# Patient Record
Sex: Female | Born: 1970 | Race: White | Hispanic: No | Smoking: Never smoker
Health system: Southern US, Community
[De-identification: ages and names within clinical notes are randomized; demographics above are authoritative.]

## PROBLEM LIST (undated history)

## (undated) DIAGNOSIS — F419 Anxiety disorder, unspecified: Secondary | ICD-10-CM

## (undated) DIAGNOSIS — M549 Dorsalgia, unspecified: Secondary | ICD-10-CM

## (undated) HISTORY — PX: MOUTH SURGERY: SHX715

## (undated) HISTORY — DX: Anxiety disorder, unspecified: F41.9

## (undated) HISTORY — DX: Dorsalgia, unspecified: M54.9

---
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
## 2019-05-15 ENCOUNTER — Telehealth: Payer: Self-pay | Admitting: *Deleted

## 2019-05-15 ENCOUNTER — Telehealth: Payer: Self-pay

## 2019-05-15 NOTE — Telephone Encounter (Signed)
Copied from CRM #[id number]. Topic: Appointment Scheduling - New Patient >> May 14, 2019  4:02 PM Crawford, Mary M wrote: New patient has been scheduled for your office. Provider: [place] Date of Appointment: 11/18  Route to department's PEC pool. 

## 2019-05-15 NOTE — Telephone Encounter (Signed)
Copied from [facility] [place] [id number]. Topic: Appointment Scheduling - New Patient >> May 14, 2019  4:02 PM Loma Boston wrote: New patient has been scheduled for your office. Provider: Conley Canal Date of Appointment: 11/18  Route to department's PEC pool.

## 2019-05-20 ENCOUNTER — Other Ambulatory Visit: Payer: Self-pay

## 2019-05-20 ENCOUNTER — Ambulatory Visit: Payer: 59 | Admitting: Family

## 2019-05-20 ENCOUNTER — Encounter: Payer: Self-pay | Admitting: Family

## 2019-05-20 VITALS — BP 112/81 | HR 73 | Temp 97.1°F | Resp 16 | Ht 65.0 in | Wt 168.0 lb

## 2019-05-20 DIAGNOSIS — F419 Anxiety disorder, unspecified: Secondary | ICD-10-CM | POA: Diagnosis not present

## 2019-05-20 DIAGNOSIS — F329 Major depressive disorder, single episode, unspecified: Secondary | ICD-10-CM

## 2019-05-20 DIAGNOSIS — F32A Depression, unspecified: Secondary | ICD-10-CM

## 2019-05-20 DIAGNOSIS — G8929 Other chronic pain: Secondary | ICD-10-CM | POA: Diagnosis not present

## 2019-05-20 DIAGNOSIS — M545 Low back pain: Secondary | ICD-10-CM

## 2019-05-20 MED ORDER — ESCITALOPRAM OXALATE 20 MG PO TABS: 20.0000 mg | ORAL_TABLET | Freq: Every day | ORAL | 0 refills | Status: DC

## 2019-05-20 MED ORDER — ESCITALOPRAM OXALATE 20 MG PO TABS: 20.0000 mg | ORAL_TABLET | Freq: Every day | ORAL | Status: DC

## 2019-05-20 MED ORDER — BUPROPION HCL ER (XL) 150 MG PO TB24: 150.0000 mg | ORAL_TABLET | Freq: Every day | ORAL | 1 refills | Status: DC

## 2019-05-20 NOTE — Patient Instructions (Addendum)
Please add Wellbutrin XL 150mg  once daily.   Continue Escitalopram. Complete lab work prior to leaving.  Call [facility] to schedule an appointment with a counselor.  Welcome to [facility]!

## 2019-05-20 NOTE — Progress Notes (Signed)
0

## 2019-05-20 NOTE — Progress Notes (Signed)
Subjective:    Patient ID: Virginia Ayala, female    DOB: 1970-11-16, 48 y.o.   MRN: [id number]  HPI   Patient is a 48 yr old female who presents today to establish care.   Anxiety/Depression- reports that she feels indifferent. She reports that her depression started at 43, though was not diagnosed until after her second child was born. She has previously on prozac and citalopram.  Was switched to escitalopram.  Sleeps   Reports that she uses alprazolam prn (seems to need more before her period) Notes occasional panic attacks.    Has 2 children adult daughter, has a 24 yr old daughter  She is seeing ortho for back pain- reports hx of bulging disc. Diagnosed 6-7 years ago.  Reports that she had ESI's but that caused weight gain so she discontinued.  Reports that she is doing PT, taking gabapentin.     Review of Systems  Constitutional: Negative for unexpected weight change.  HENT: Negative for hearing loss and rhinorrhea.   Eyes: Negative for visual disturbance.  Respiratory: Negative for cough.   Cardiovascular: Negative for leg swelling.  Gastrointestinal: Negative for constipation and diarrhea.  Genitourinary: Negative for dysuria and frequency.  Musculoskeletal: Positive for back pain. Negative for arthralgias (occasional right knee pain) and myalgias.  Skin: Negative for rash.  Neurological: Negative for headaches.  Hematological: Negative for adenopathy.  Psychiatric/Behavioral:       See HPI    Past Medical History:  Diagnosis Date  . Anxiety   . 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 Depression      Social History   Socioeconomic History  . Marital status: Divorced    Spouse name: Not on file  . Number of children: Not on file  . Years of education: Not on file  . Highest education level: Not on file  Occupational History  . Not on file  Social Needs  . Financial resource strain: Not on file  . Food insecurity    Worry: Not on file   Inability: Not on file  . Transportation needs    Medical: Not on file    Non-medical: Not on file  Tobacco Use  . Smoking status: Never Smoker  . Smokeless tobacco: Never Used  Substance and Sexual Activity  . Alcohol use: Not Currently  . Drug use: Never  . Sexual activity: Not Currently  Lifestyle  . Physical activity    Days per week: 0 days    Minutes per session: Not on file  . Stress: Not on file  Relationships  . Social connections    Talks on phone: More than three times a week    Gets together: Once a week    Attends religious service: Never    Active member of club or organization: No    Attends meetings of clubs or organizations: Never    Relationship status: Separated  . Intimate partner violence    Fear of current or ex partner: No    Emotionally abused: No    Physically abused: No    Forced sexual activity: No  Other Topics Concern  . 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 MOUTH SURGERY     in her 58's  . ORTHOPEDIC SURGERY Left  1980    Family History  Problem Relation Age of Onset  . Diabetes Father   . Pulmonary embolism Father   . Cancer Maternal Grandmother   . Heart disease Maternal Grandmother   . Heart disease Paternal Grandfather   . Pulmonary embolism Paternal Aunt     Allergies  Allergen Reactions  . Lodine [Etodolac]   . Penicillins   . Sulfa Antibiotics     Current Outpatient Medications on File Prior to Visit  Medication Sig Dispense Refill  . ALPRAZolam (XANAX) 0.5 MG tablet Take 0.5 mg by mouth at bedtime as needed for anxiety.    . gabapentin (NEURONTIN) 100 MG capsule Take 100 mg by mouth 3 (three) times daily.     No current facility-administered medications on file prior to visit.     BP 112/81 (BP Location: Right Arm, Patient Position: Sitting, Cuff Size:  Small)   Pulse 73   Temp (!) 97.1 F (36.2 C) (Temporal)   Resp 16   Ht 5\' 5"  (1.651 m)   Wt 168 lb (76.2 kg)   SpO2 98%   BMI 27.96 kg/m        Objective:   Physical Exam Constitutional:      Appearance: She is well-developed.  Neck:     Musculoskeletal: Neck supple.     Thyroid: No thyromegaly.  Cardiovascular:     Rate and Rhythm: Normal rate and regular rhythm.     Heart sounds: Normal heart sounds. No murmur.  Pulmonary:     Effort: Pulmonary effort is normal. No respiratory distress.     Breath sounds: Normal breath sounds. No wheezing.  Musculoskeletal:        General: No swelling.  Skin:    General: Skin is warm and dry.  Neurological:     Mental Status: She is alert and oriented to person, place, and time.  Psychiatric:        Attention and Perception: Attention normal.        Mood and Affect: Affect is flat.        Speech: Speech is delayed (slightly delayed speech).        Behavior: Behavior normal.        Thought Content: Thought content normal.        Judgment: Judgment normal.           Assessment & Plan:  Depression- uncontrolled. Suggested that she establish with a local counselor. I have given her the contact information for [place]. Will also add wellbutrin once daily.  Chronic back pain- following with orthopedics. Maintained on gabapentin which is helping her pain.  Anxiety- using xanax PRN. A controlled substance contract is signed today and will obtain a UDS.   Her previous practice was at [facility] [place]- plan to have her sign a records release next visit so we can obtain records.

## 2019-05-21 LAB — PAIN MGMT, PROFILE 8 W/CONF, U
6 Acetylmorphine: NEGATIVE ng/mL
Alcohol Metabolites: NEGATIVE ng/mL (ref <500)
Amphetamines: NEGATIVE ng/mL
Benzodiazepines: NEGATIVE ng/mL
Buprenorphine, Urine: NEGATIVE ng/mL
Cocaine Metabolite: NEGATIVE ng/mL
Creatinine: 145.1 mg/dL
MDMA: NEGATIVE ng/mL
Marijuana Metabolite: NEGATIVE ng/mL
Opiates: NEGATIVE ng/mL
Oxidant: NEGATIVE ug/mL
Oxycodone: NEGATIVE ng/mL
pH: 6.3 (ref 4.5–9.0)

## 2019-06-23 ENCOUNTER — Telehealth: Payer: Self-pay | Admitting: Family

## 2019-06-23 ENCOUNTER — Other Ambulatory Visit: Payer: Self-pay

## 2019-06-23 ENCOUNTER — Encounter: Payer: Self-pay | Admitting: Family

## 2019-06-23 ENCOUNTER — Ambulatory Visit (INDEPENDENT_AMBULATORY_CARE_PROVIDER_SITE_OTHER): Payer: 59 | Admitting: Family

## 2019-06-23 VITALS — BP 115/81 | HR 69 | Temp 96.9°F | Resp 16 | Ht 65.0 in | Wt 172.0 lb

## 2019-06-23 DIAGNOSIS — F32A Depression, unspecified: Secondary | ICD-10-CM | POA: Insufficient documentation

## 2019-06-23 DIAGNOSIS — Z0000 Encounter for general adult medical examination without abnormal findings: Secondary | ICD-10-CM

## 2019-06-23 DIAGNOSIS — F329 Major depressive disorder, single episode, unspecified: Secondary | ICD-10-CM | POA: Diagnosis not present

## 2019-06-23 HISTORY — DX: Depression, unspecified: F32.A

## 2019-06-23 HISTORY — DX: Major depressive disorder, single episode, unspecified: F32.9

## 2019-06-23 MED ORDER — BUPROPION HCL ER (XL) 300 MG PO TB24: 300.0000 mg | ORAL_TABLET | Freq: Every day | ORAL | 3 refills | Status: DC

## 2019-06-23 NOTE — Progress Notes (Signed)
Subjective:    Patient ID: Virginia Ayala, female    DOB: Dec 12, 1970, 48 y.o.   MRN: [id number]  HPI  Patient presents today for complete physical.  Immunizations: declines flu shot, last tetanus was 2013.   Diet: concerned about weight gain.  Drinks water with crystal light Wt Readings from Last 3 Encounters:  06/23/19 172 lb (78 kg)  05/20/19 168 lb (76.2 kg)  Exercise: limited by lumbar disc due to pain  Pap Smear: 2 yrs ago Mammogram: 2  Years ago  Depression- last visit symptoms were noted to be uncontrolled. We added wellbutrin to her lexapro and recommended that she establish with a local counselor.   No side effects on wellbutrin. Reports trouble motivating, but denies trouble sleeping. Especially getting out of bed in the morning. Does not have many things she looks forward to.   Review of Systems  Constitutional: Negative for unexpected weight change.  HENT: Negative for hearing loss and rhinorrhea.   Eyes: Negative for visual disturbance.  Respiratory: Negative for cough and shortness of breath.   Cardiovascular: Negative for chest pain.  Gastrointestinal: Negative for constipation and diarrhea.  Genitourinary: Negative for dysuria and frequency.  Musculoskeletal: Positive for back pain.  Skin: Negative for rash.  Neurological: Negative for numbness.  Hematological: Negative for adenopathy.  Psychiatric/Behavioral:       See HPI   Past Medical History:  Diagnosis Date  . Anxiety   . 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 Depression      Social History   Socioeconomic History  . Marital status: Divorced    Spouse name: Not on file  . Number of children: Not on file  . Years of education: Not on file  . Highest education level: Not on file  Occupational History  . Not on file  Tobacco Use  . Smoking status: Never Smoker  . Smokeless tobacco: Never Used  Substance and Sexual Activity  . Alcohol use: Not Currently  . Drug use: Never  .  Sexual activity: Not Currently  Other Topics Concern  . 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 Difficulty of Paying Living Expenses: Not on file  Food Insecurity:   . Worried About Programme researcher, broadcasting/film/video in the Last Year: Not on file  . Ran Out of Food in the Last Year: Not on file  Transportation Needs:   . Lack of Transportation (Medical): Not on file  . Lack of Transportation (Non-Medical): Not on file  Physical Activity: Unknown  . Days of Exercise per Week: 0 days  . Minutes of Exercise per Session: Not on file  Stress:   . Feeling of Stress : Not on file  Social Connections: Moderately Isolated  . Frequency of Communication with Friends and Family: More than three times a week  . Frequency of Social Gatherings with Friends and Family: Once a week  . Attends Religious Services: Never  . Active Member of Clubs or Organizations: No  . Attends Banker Meetings: Never  . Marital Status: Separated  Intimate Partner Violence: Not At Risk  . Fear of Current or Ex-Partner: No  . Emotionally Abused: No  . Physically Abused: No  . Sexually Abused: No    Past Surgical  History:  Procedure Laterality Date  . MOUTH SURGERY     in her 64's  . ORTHOPEDIC SURGERY Left 1980    Family History  Problem Relation Age of Onset  . Diabetes Father   . Pulmonary embolism Father   . Cancer Maternal Grandmother   . Heart disease Maternal Grandmother   . Heart disease Paternal Grandfather   . Pulmonary embolism Paternal Aunt     Allergies  Allergen Reactions  . Lodine [Etodolac]   . Penicillins   . Sulfa Antibiotics     Current Outpatient Medications on File Prior to Visit  Medication Sig Dispense Refill  . ALPRAZolam (XANAX) 0.5 MG tablet Take 0.5 mg by mouth at bedtime as needed for anxiety.    Marland Kitchen  escitalopram (LEXAPRO) 20 MG tablet Take 1 tablet (20 mg total) by mouth daily. 90 tablet 0  . gabapentin (NEURONTIN) 100 MG capsule Take 100 mg by mouth 3 (three) times daily.     No current facility-administered medications on file prior to visit.    BP 115/81 (BP Location: Right Arm, Patient Position: Sitting, Cuff Size: Large)   Pulse 69   Temp (!) 96.9 F (36.1 C) (Temporal)   Resp 16   Ht 5\' 5"  (1.651 m)   Wt 172 lb (78 kg)   SpO2 98%   BMI 28.62 kg/m       Objective:   Physical Exam  Physical Exam  Constitutional: She is oriented to person, place, and time. She appears well-developed and well-nourished. No distress.  HENT:  Head: Normocephalic and atraumatic.  Right Ear: Tympanic membrane and ear canal normal.  Left Ear: Tympanic membrane and ear canal normal.  Mouth/Throat: Not examined, pt wearing mask Eyes: Pupils are equal, round, and reactive to light. No scleral icterus.  Neck: Normal range of motion. No thyromegaly present.  Cardiovascular: Normal rate and regular rhythm.   No murmur heard. Pulmonary/Chest: Effort normal and breath sounds normal. No respiratory distress. He has no wheezes. She has no rales. She exhibits no tenderness.  Abdominal: Soft. Bowel sounds are normal. She exhibits no distension and no mass. There is no tenderness. There is no rebound and no guarding.  Musculoskeletal: She exhibits no edema.  Lymphadenopathy:    She has no cervical adenopathy.  Neurological: She is alert and oriented to person, place, and time. She has normal patellar reflexes. She exhibits normal muscle tone. Coordination normal. 3+ bilateral patellar reflexes Skin: Skin is warm and dry.  Psychiatric: She has a somewhat flattened mood and affect. Her behavior is normal. Judgment and thought content normal.  Breasts: Examined lying Right: Without masses, retractions, discharge or axillary adenopathy.  Left: Without masses, retractions, discharge or axillary adenopathy.   Pelvic: deferred       Assessment & Plan:   Preventative care- discussed healthy diet, exercise and weight loss. Mammogram due. Pap and tetanus up to date per patient. I still have not received her old records.   Depression- uncontrolled. Will increase wellbutrin from 150 to 300mg  xr daily. Continue lexapro. Discussed value of counseling. Plan follow up in 1 month. If no improvement consider referral to psychiatry.   This visit occurred during the SARS-CoV-2 public health emergency.  Safety protocols were in place, including screening questions prior to the visit, additional usage of staff PPE, and extensive cleaning of exam room while observing appropriate contact time as indicated for disinfecting solutions.    Assessment & Plan:

## 2019-06-23 NOTE — Patient Instructions (Addendum)
Please complete lab work prior to leaving.  Increase wellbutrin to 383m once daily.   Preventive Care [id number] Old, Female Preventive care refers to visits with your health care provider and lifestyle choices that can promote health and wellness. This includes:  A yearly physical exam. This may also be called an annual well check.  Regular dental visits and eye exams.  Immunizations.  Screening for certain conditions.  Healthy lifestyle choices, such as eating a healthy diet, getting regular exercise, not using drugs or products that contain nicotine and tobacco, and limiting alcohol use. What can I expect for my preventive care visit? Physical exam Your health care provider will check your:  Height and weight. This may be used to calculate body mass index (BMI), which tells if you are at a healthy weight.  Heart rate and blood pressure.  Skin for abnormal spots. Counseling Your health care provider may ask you questions about your:  Alcohol, tobacco, and drug use.  Emotional well-being.  Home and relationship well-being.  Sexual activity.  Eating habits.  Work and work eStatistician  Method of birth control.  Menstrual cycle.  Pregnancy history. What immunizations do I need?  Influenza (flu) vaccine  This is recommended every year. Tetanus, diphtheria, and pertussis (Tdap) vaccine  You may need a Td booster every 10 years. Varicella (chickenpox) vaccine  You may need this if you have not been vaccinated. Zoster (shingles) vaccine  You may need this after age 49 Measles, mumps, and rubella (MMR) vaccine  You may need at least one dose of MMR if you were born in 1957 or later. You may also need a second dose. Pneumococcal conjugate (PCV13) vaccine  You may need this if you have certain conditions and were not previously vaccinated. Pneumococcal polysaccharide (PPSV23) vaccine  You may need one or two doses if you smoke cigarettes or if you have  certain conditions. Meningococcal conjugate (MenACWY) vaccine  You may need this if you have certain conditions. Hepatitis A vaccine  You may need this if you have certain conditions or if you travel or work in places where you may be exposed to hepatitis A. Hepatitis B vaccine  You may need this if you have certain conditions or if you travel or work in places where you may be exposed to hepatitis B. Haemophilus influenzae type b (Hib) vaccine  You may need this if you have certain conditions. Human papillomavirus (HPV) vaccine  If recommended by your health care provider, you may need three doses over 6 months. You may receive vaccines as individual doses or as more than one vaccine together in one shot (combination vaccines). Talk with your health care provider about the risks and benefits of combination vaccines. What tests do I need? Blood tests  Lipid and cholesterol levels. These may be checked every 5 years, or more frequently if you are over 493years old.  Hepatitis C test.  Hepatitis B test. Screening  Lung cancer screening. You may have this screening every year starting at age 4949if you have a 30-pack-year history of smoking and currently smoke or have quit within the past 15 years.  Colorectal cancer screening. All adults should have this screening starting at age 4949and continuing until age 49 Your health care provider may recommend screening at age 2513if you are at increased risk. You will have tests every 1-10 years, depending on your results and the type of screening test.  Diabetes screening. This is done by checking your  blood sugar (glucose) after you have not eaten for a while (fasting). You may have this done every 1-3 years.  Mammogram. This may be done every 1-2 years. Talk with your health care provider about when you should start having regular mammograms. This may depend on whether you have a family history of breast cancer.  BRCA-related cancer  screening. This may be done if you have a family history of breast, ovarian, tubal, or peritoneal cancers.  Pelvic exam and Pap test. This may be done every 3 years starting at age 49. Starting at age 46, this may be done every 5 years if you have a Pap test in combination with an HPV test. Other tests  Sexually transmitted disease (STD) testing.  Bone density scan. This is done to screen for osteoporosis. You may have this scan if you are at high risk for osteoporosis. Follow these instructions at home: Eating and drinking  Eat a diet that includes fresh fruits and vegetables, whole grains, lean protein, and low-fat dairy.  Take vitamin and mineral supplements as recommended by your health care provider.  Do not drink alcohol if: ? Your health care provider tells you not to drink. ? You are pregnant, may be pregnant, or are planning to become pregnant.  If you drink alcohol: ? Limit how much you have to 0-1 drink a day. ? Be aware of how much alcohol is in your drink. In the [place] one drink equals one 12 oz bottle of beer (355 mL), one 5 oz glass of wine (148 mL), or one 1 oz glass of hard liquor (44 mL). Lifestyle  Take daily care of your teeth and gums.  Stay active. Exercise for at least 30 minutes on 5 or more days each week.  Do not use any products that contain nicotine or tobacco, such as cigarettes, e-cigarettes, and chewing tobacco. If you need help quitting, ask your health care provider.  If you are sexually active, practice safe sex. Use a condom or other form of birth control (contraception) in order to prevent pregnancy and STIs (sexually transmitted infections).  If told by your health care provider, take low-dose aspirin daily starting at age 51. What's next?  Visit your health care provider once a year for a well check visit.  Ask your health care provider how often you should have your eyes and teeth checked.  Stay up to date on all vaccines. This  information is not intended to replace advice given to you by your health care provider. Make sure you discuss any questions you have with your health care provider. Document Released: 07/15/2015 Document Revised: 02/27/2018 Document Reviewed: 02/27/2018 Elsevier Patient Education  2020 [employer].

## 2019-06-23 NOTE — Telephone Encounter (Signed)
Do you mind checking status of her records release from old PCP?

## 2019-06-24 LAB — HEPATIC FUNCTION PANEL
ALT: 27 U/L (ref 0–35)
AST: 21 U/L (ref 0–37)
Albumin: 4.4 g/dL (ref 3.5–5.2)
Alkaline Phosphatase: 83 U/L (ref 39–117)
Bilirubin, Direct: 0 mg/dL (ref 0.0–0.3)
Total Bilirubin: 0.3 mg/dL (ref 0.2–1.2)
Total Protein: 7.3 g/dL (ref 6.0–8.3)

## 2019-06-24 LAB — LIPID PANEL
Cholesterol: 306 mg/dL — ABNORMAL HIGH (ref 0–200)
HDL: 53 mg/dL (ref >39.00)
LDL Cholesterol: 225 mg/dL — ABNORMAL HIGH (ref 0–99)
NonHDL: 252.75
Total CHOL/HDL Ratio: 6
Triglycerides: 139 mg/dL (ref 0.0–149.0)
VLDL: 27.8 mg/dL (ref 0.0–40.0)

## 2019-06-24 LAB — CBC WITH DIFFERENTIAL/PLATELET
Basophils Absolute: 0.1 10*3/uL (ref 0.0–0.1)
Basophils Relative: 1.9 % (ref 0.0–3.0)
Eosinophils Absolute: 0.3 10*3/uL (ref 0.0–0.7)
Eosinophils Relative: 5.4 % — ABNORMAL HIGH (ref 0.0–5.0)
HCT: 42.1 % (ref 36.0–46.0)
Hemoglobin: 13.9 g/dL (ref 12.0–15.0)
Lymphocytes Relative: 36.1 % (ref 12.0–46.0)
Lymphs Abs: 2.2 10*3/uL (ref 0.7–4.0)
MCHC: 33 g/dL (ref 30.0–36.0)
MCV: 92 fl (ref 78.0–100.0)
Monocytes Absolute: 0.6 10*3/uL (ref 0.1–1.0)
Monocytes Relative: 10.2 % (ref 3.0–12.0)
Neutro Abs: 2.9 10*3/uL (ref 1.4–7.7)
Neutrophils Relative %: 46.4 % (ref 43.0–77.0)
Platelets: 249 10*3/uL (ref 150.0–400.0)
RBC: 4.57 Mil/uL (ref 3.87–5.11)
RDW: 13.2 % (ref 11.5–15.5)
WBC: 6.2 10*3/uL (ref 4.0–10.5)

## 2019-06-24 LAB — BASIC METABOLIC PANEL
BUN: 14 mg/dL (ref 6–23)
CO2: 28 mEq/L (ref 19–32)
Calcium: 9.2 mg/dL (ref 8.4–10.5)
Chloride: 102 mEq/L (ref 96–112)
Creatinine, Ser: 0.97 mg/dL (ref 0.40–1.20)
GFR: 61.04 mL/min (ref >60.00)
Glucose, Bld: 101 mg/dL — ABNORMAL HIGH (ref 70–99)
Potassium: 3.9 mEq/L (ref 3.5–5.1)
Sodium: 137 mEq/L (ref 135–145)

## 2019-06-24 LAB — TSH: TSH: 0.87 u[IU]/mL (ref 0.35–4.50)

## 2019-06-24 MED ORDER — ALPRAZOLAM 0.5 MG PO TABS: 0.5000 mg | ORAL_TABLET | Freq: Every evening | ORAL | 0 refills | Status: DC | PRN

## 2019-06-24 NOTE — Telephone Encounter (Signed)
See mychart.  

## 2019-06-24 NOTE — Telephone Encounter (Signed)
Called [facility] in [place], was not bale to get information on status of records. Records release form faxed again to number provided by them.

## 2019-06-28 ENCOUNTER — Encounter: Payer: Self-pay | Admitting: Family

## 2019-07-21 ENCOUNTER — Other Ambulatory Visit: Payer: Self-pay

## 2019-07-21 ENCOUNTER — Encounter (HOSPITAL_BASED_OUTPATIENT_CLINIC_OR_DEPARTMENT_OTHER): Payer: Self-pay

## 2019-07-21 ENCOUNTER — Ambulatory Visit (HOSPITAL_BASED_OUTPATIENT_CLINIC_OR_DEPARTMENT_OTHER)
Admission: RE | Admit: 2019-07-21 | Discharge: 2019-07-21 | Disposition: A | Discharge status: Home / Self Care | Payer: 59 | Source: Ambulatory Visit | Attending: Family | Admitting: Family

## 2019-07-21 DIAGNOSIS — Z1231 Encounter for screening mammogram for malignant neoplasm of breast: Secondary | ICD-10-CM | POA: Diagnosis present

## 2019-07-21 DIAGNOSIS — Z0000 Encounter for general adult medical examination without abnormal findings: Secondary | ICD-10-CM

## 2019-07-27 ENCOUNTER — Other Ambulatory Visit: Payer: Self-pay

## 2019-07-27 ENCOUNTER — Ambulatory Visit (INDEPENDENT_AMBULATORY_CARE_PROVIDER_SITE_OTHER): Payer: 59 | Admitting: Family

## 2019-07-27 ENCOUNTER — Telehealth: Payer: Self-pay | Admitting: Family

## 2019-07-27 ENCOUNTER — Encounter: Payer: Self-pay | Admitting: Family

## 2019-07-27 DIAGNOSIS — F329 Major depressive disorder, single episode, unspecified: Secondary | ICD-10-CM | POA: Diagnosis not present

## 2019-07-27 DIAGNOSIS — F32A Depression, unspecified: Secondary | ICD-10-CM

## 2019-07-27 NOTE — Telephone Encounter (Signed)
See mychart.  

## 2019-07-27 NOTE — Progress Notes (Signed)
Virtual Visit via Video Note  I connected with Brodie Jagiello on 07/27/19 at 12:00 PM EST by a video enabled telemedicine application and verified that I am speaking with the correct person using two identifiers.  Location: Patient: home Provider: home   I discussed the limitations of evaluation and management by telemedicine and the availability of in person appointments. The patient expressed understanding and agreed to proceed.  History of Present Illness:   Patient is a 49 year old female who presents today for follow-up of her depression.  Last visit she was on Lexapro and Wellbutrin 150 mg once daily.  She noted that she was continuing to have trouble motivating and energy remains low.  As a result, we increased her Wellbutrin XL to 300 mg once daily.  She is continued on Lexapro.  She reports that since we increased this dose she is feeling much better.  She notes improved energy and improved motivation.  She has even started cleaning her house which she is surprised about.  She notes that she is not sleeping as much.  Her weight is down to 168 which is 4 pounds less than last visit. Observations/Objective:   Gen: Awake, alert, no acute distress Resp: Breathing is even and non-labored Psych: calm/pleasant demeanor Neuro: Alert and Oriented x 3, + facial symmetry, speech is clear.   Assessment and Plan:  Depression- much improved on current regimen/doses. Continue same.   She completed a mammogram on 1/19. This has not yet been read. Will check status.   Follow Up Instructions:    I discussed the assessment and treatment plan with the patient. The patient was provided an opportunity to ask questions and all were answered. The patient agreed with the plan and demonstrated an understanding of the instructions.   The patient was advised to call back or seek an in-person evaluation if the symptoms worsen or if the condition fails to improve as anticipated.  Lemont Fillers,  NP

## 2019-09-03 ENCOUNTER — Other Ambulatory Visit: Payer: Self-pay | Admitting: *Deleted

## 2019-09-03 MED ORDER — ESCITALOPRAM OXALATE 20 MG PO TABS: 20.0000 mg | ORAL_TABLET | Freq: Every day | ORAL | 1 refills | Status: DC

## 2019-10-17 ENCOUNTER — Other Ambulatory Visit: Payer: Self-pay

## 2019-10-17 ENCOUNTER — Encounter (HOSPITAL_BASED_OUTPATIENT_CLINIC_OR_DEPARTMENT_OTHER): Payer: Self-pay | Admitting: Emergency Medicine

## 2019-10-17 ENCOUNTER — Emergency Department (HOSPITAL_BASED_OUTPATIENT_CLINIC_OR_DEPARTMENT_OTHER)
Admission: EM | Admit: 2019-10-17 | Discharge: 2019-10-17 | Disposition: A | Discharge status: Home / Self Care | Payer: 59 | Attending: Emergency Medicine | Admitting: Emergency Medicine

## 2019-10-17 ENCOUNTER — Emergency Department (HOSPITAL_BASED_OUTPATIENT_CLINIC_OR_DEPARTMENT_OTHER): Payer: 59

## 2019-10-17 DIAGNOSIS — Z23 Encounter for immunization: Secondary | ICD-10-CM | POA: Diagnosis not present

## 2019-10-17 DIAGNOSIS — Y999 Unspecified external cause status: Secondary | ICD-10-CM | POA: Insufficient documentation

## 2019-10-17 DIAGNOSIS — Y9389 Activity, other specified: Secondary | ICD-10-CM | POA: Diagnosis not present

## 2019-10-17 DIAGNOSIS — Z79899 Other long term (current) drug therapy: Secondary | ICD-10-CM | POA: Insufficient documentation

## 2019-10-17 DIAGNOSIS — W540XXA Bitten by dog, initial encounter: Secondary | ICD-10-CM | POA: Insufficient documentation

## 2019-10-17 DIAGNOSIS — Y9289 Other specified places as the place of occurrence of the external cause: Secondary | ICD-10-CM | POA: Diagnosis not present

## 2019-10-17 DIAGNOSIS — S61451A Open bite of right hand, initial encounter: Secondary | ICD-10-CM | POA: Insufficient documentation

## 2019-10-17 MED ORDER — CLINDAMYCIN HCL 150 MG PO CAPS: 450.0000 mg | ORAL_CAPSULE | Freq: Three times a day (TID) | ORAL | 0 refills | Status: AC

## 2019-10-17 MED ORDER — LIDOCAINE HCL (PF) 1 % IJ SOLN
10.0000 mL | Freq: Once | INTRAMUSCULAR | Status: DC
  Filled 2019-10-17: qty 10

## 2019-10-17 MED ORDER — DOXYCYCLINE HYCLATE 100 MG PO TABS
100.0000 mg | ORAL_TABLET | Freq: Once | ORAL | Status: AC
  Administered 2019-10-17: 20:00:00 100 mg via ORAL
  Filled 2019-10-17: qty 1

## 2019-10-17 MED ORDER — CLINDAMYCIN HCL 150 MG PO CAPS
450.0000 mg | ORAL_CAPSULE | Freq: Once | ORAL | Status: AC
  Administered 2019-10-17: 450 mg via ORAL
  Filled 2019-10-17: qty 3

## 2019-10-17 MED ORDER — TETANUS-DIPHTH-ACELL PERTUSSIS 5-2.5-18.5 LF-MCG/0.5 IM SUSP
0.5000 mL | Freq: Once | INTRAMUSCULAR | Status: AC
  Administered 2019-10-17: 0.5 mL via INTRAMUSCULAR
  Filled 2019-10-17: qty 0.5

## 2019-10-17 MED ORDER — BACITRACIN ZINC 500 UNIT/GM EX OINT
TOPICAL_OINTMENT | Freq: Once | CUTANEOUS | Status: AC
  Filled 2019-10-17: qty 28.35

## 2019-10-17 MED ORDER — OXYCODONE-ACETAMINOPHEN 5-325 MG PO TABS
1.0000 | ORAL_TABLET | Freq: Once | ORAL | Status: AC
  Administered 2019-10-17: 20:00:00 1 via ORAL
  Filled 2019-10-17: qty 1

## 2019-10-17 MED ORDER — DOXYCYCLINE HYCLATE 100 MG PO CAPS: 100.0000 mg | ORAL_CAPSULE | Freq: Two times a day (BID) | ORAL | 0 refills | Status: AC

## 2019-10-17 MED ORDER — ONDANSETRON 4 MG PO TBDP
4.0000 mg | ORAL_TABLET | Freq: Once | ORAL | Status: AC
  Administered 2019-10-17: 20:00:00 4 mg via ORAL
  Filled 2019-10-17: qty 1

## 2019-10-17 NOTE — ED Provider Notes (Signed)
[facility] [place] EMERGENCY DEPARTMENT Provider Note   CSN: [id number] Arrival date & time: 10/17/19  1820     History Chief Complaint  Patient presents with  . Animal Bite    Virginia Ayala is a 49 y.o. female who presents for evaluation of dog bite to right hand that occurred just prior to ED arrival.  Patient reports she was playing with her dog and states that he accidentally bit her on the right hand.  She reports that the dog is up-to-date on rabies vaccine.  Her tetanus is not up-to-date.  She does not any difficulty moving the hand denies any numbness/weakness.  The history is provided by the patient.       Past Medical History:  Diagnosis Date  . Anxiety   . 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 Depression   . Depression 06/23/2019    Patient Active Problem List   Diagnosis Date Noted  . Depression 06/23/2019    Past Surgical History:  Procedure Laterality Date  . MOUTH SURGERY     in her 6's  . ORTHOPEDIC SURGERY Left 1980     OB History   No obstetric history on file.     Family History  Problem Relation Age of Onset  . Diabetes Father   . Pulmonary embolism Father   . Cancer Maternal Grandmother   . Heart disease Maternal Grandmother   . Heart disease Paternal Grandfather   . Pulmonary embolism Paternal Aunt     Social History   Tobacco Use  . Smoking status: Never Smoker  . Smokeless tobacco: Never Used  Substance Use Topics  . Alcohol use: Not Currently  . Drug use: Never    Home Medications Prior to Admission medications   Medication Sig Start Date End Date Taking? Authorizing Provider  ALPRAZolam Prudy Feeler) 0.5 MG tablet Take 1 tablet (0.5 mg total) by mouth at bedtime as needed for anxiety. 06/24/19   Sandford Craze, NP  buPROPion (WELLBUTRIN XL) 300 MG 24 hr tablet Take 1 tablet (300 mg total) by mouth daily. 06/23/19   Sandford Craze, NP  clindamycin (CLEOCIN) 150 MG capsule Take 3 capsules (450 mg total)  by mouth 3 (three) times daily for 7 days. 10/17/19 10/24/19  Maxwell Caul, PA-C  doxycycline (VIBRAMYCIN) 100 MG capsule Take 1 capsule (100 mg total) by mouth 2 (two) times daily for 7 days. 10/17/19 10/24/19  Maxwell Caul, PA-C  escitalopram (LEXAPRO) 20 MG tablet Take 1 tablet (20 mg total) by mouth daily. 09/03/19   Sandford Craze, NP  gabapentin (NEURONTIN) 100 MG capsule Take 100 mg by mouth 3 (three) times daily.    [provider]    Allergies    Sulfa antibiotics, Lodine [etodolac], and Penicillins  Review of Systems   Review of Systems  Skin: Positive for wound.  Neurological: Negative for weakness and numbness.  All other systems reviewed and are negative.   Physical Exam Updated Vital Signs BP 121/89 (BP Location: Left Arm)   Pulse 84   Temp 98.3 F (36.8 C) (Oral)   Resp 18   Ht 5\' 5"  (1.651 m)   Wt 79.4 kg   SpO2 100%   BMI 29.12 kg/m   Physical Exam Vitals and nursing note reviewed.  Constitutional:      Appearance: She is well-developed.  HENT:     Head: Normocephalic and atraumatic.  Eyes:     General: No scleral icterus.  Right eye: No discharge.        Left eye: No discharge.     Conjunctiva/sclera: Conjunctivae normal.  Cardiovascular:     Pulses:          Radial pulses are 2+ on the right side and 2+ on the left side.  Pulmonary:     Effort: Pulmonary effort is normal.  Musculoskeletal:     Comments: Full flexion/tension of all 5 digits intact fine difficulty.  She can easily make a fist and extend all of her fingers.  Skin:    General: Skin is warm and dry.     Comments: 2 cm laceration noted right hand that begins at the dorsal aspect of her right hand proximal to the MCP of the index finger that extends laterally to the side.  Good distal cap refill.  RUE is not dusky in appearance or cool to touch.  Neurological:     Mental Status: She is alert.  Psychiatric:        Speech: Speech normal.        Behavior: Behavior  normal.        ED Results / Procedures / Treatments   Labs (all labs ordered are listed, but only abnormal results are displayed) Labs Reviewed - No data to display  EKG None  Radiology DG Hand Complete Right  Result Date: 10/17/2019 CLINICAL DATA:  Dog bite between thumb and index finger of right hand today. EXAM: RIGHT HAND - COMPLETE 3+ VIEW COMPARISON:  None. FINDINGS: There is no evidence of fracture or dislocation. There is no evidence of arthropathy or other focal bone abnormality. Skin irregularity between the thumb and index finger, no tracking soft tissue air or radiopaque foreign body. IMPRESSION: Skin irregularity between the thumb and index finger corresponding to reported site of dog bite. No radiopaque foreign body or osseous abnormality. Electronically Signed   By: Narda Rutherford M.D.   On: 10/17/2019 19:06    Procedures Procedures (including critical care time)  Medications Ordered in ED Medications  Tdap (BOOSTRIX) injection 0.5 mL (0.5 mLs Intramuscular Given 10/17/19 1908)  doxycycline (VIBRA-TABS) tablet 100 mg (100 mg Oral Given 10/17/19 1943)  clindamycin (CLEOCIN) capsule 450 mg (450 mg Oral Given 10/17/19 1943)  oxyCODONE-acetaminophen (PERCOCET/ROXICET) 5-325 MG per tablet 1 tablet (1 tablet Oral Given 10/17/19 1943)  ondansetron (ZOFRAN-ODT) disintegrating tablet 4 mg (4 mg Oral Given 10/17/19 1943)  bacitracin ointment ( Topical Given 10/17/19 1944)    ED Course  I have reviewed the triage vital signs and the nursing notes.  Pertinent labs & imaging results that were available during my care of the patient were reviewed by me and considered in my medical decision making (see chart for details).    MDM Rules/Calculators/A&P                      49 year old female who presents for evaluation of dog bite noted to her right hand.  She states that she was playing with her dog when it accidentally bit her.  He is up-to-date on vaccines.  Her tetanus is not  up-to-date. Patient is afebrile, non-toxic appearing, sitting comfortably on examination table. Vital signs reviewed and stable. Patient is neurovascularly intact.  Patient with a 2 cm wound noted to the right hand begins at the dorsal aspect extends laterally proximal to the MCP of the index finger.  Full range of motion with any difficulty.  Plan for wound care, tetanus, x-ray.  Reviewed.  Negative  for any acute bony abnormality.  The wound was thoroughly extensively cleaned with over a liter of sterile saline.  No evidence of foreign body.  I discussed with patient that given the nature of the wound, no indication for primary closure at this time.  We will plan for antibiotics.  Patient is allergic to sulfa and penicillins.  Plan for clindamycin, doxycycline.  Encouraged at home supportive care measures. At this time, patient exhibits no emergent life-threatening condition that require further evaluation in ED. Patient had ample opportunity for questions and discussion. All patient's questions were answered with full understanding. Strict return precautions discussed. Patient expresses understanding and agreement to plan.   Portions of this note were generated with Lobbyist. Dictation errors may occur despite best attempts at proofreading.   Final Clinical Impression(s) / ED Diagnoses Final diagnoses:  Dog bite, initial encounter    Rx / DC Orders ED Discharge Orders         Ordered    doxycycline (VIBRAMYCIN) 100 MG capsule  2 times daily     10/17/19 1929    clindamycin (CLEOCIN) 150 MG capsule  3 times daily     10/17/19 1929           Desma Mcgregor 10/17/19 2233    Lucrezia Starch, MD 10/19/19 [phone]

## 2019-10-17 NOTE — Discharge Instructions (Signed)
Gently clean the wound with soap and water. Make sure to pat dry the wound before covering it with any dressing. You can use topical antibiotic ointment and bandage. Ice and elevate for pain relief.   You can take Tylenol or Ibuprofen as directed for pain. You can alternate Tylenol and Ibuprofen every 4 hours for additional pain relief.   Take antibiotics as directed. Please take all of your antibiotics until finished.  Monitor closely for any signs of infection. Return to the Emergency Department for any worsening redness/swelling of the area that begins to spread, drainage from the site, worsening pain, fever or any other worsening or concerning symptoms.   

## 2019-10-17 NOTE — ED Triage Notes (Signed)
Pt reports that pts dog bit right hand while playing. Pt endorses dogs rabies vaccine utd. Laceration present, bleeding controlled

## 2019-10-30 ENCOUNTER — Telehealth: Payer: Self-pay | Admitting: Family

## 2019-10-30 ENCOUNTER — Other Ambulatory Visit: Payer: Self-pay

## 2019-10-30 MED ORDER — BUPROPION HCL ER (XL) 300 MG PO TB24: 300.0000 mg | ORAL_TABLET | Freq: Every day | ORAL | 3 refills | Status: DC

## 2019-10-30 NOTE — Telephone Encounter (Signed)
Medication: buPROPion (WELLBUTRIN XL) 300 MG 24 hr tablet [[id number]]    Has the patient contacted their pharmacy? No. (If no, request that the patient contact the pharmacy for the refill.) (If yes, when and what did the pharmacy advise?)  Preferred Pharmacy (with phone number or street name) [id number] - [place], [place] - [address] AT [facility] [address]  [address], [place] [address]  Phone:  [phone] Fax:  [phone]  DEA #:  [id number]     Agent: Please be advised that RX refills may take up to 3 business days. We ask that you follow-up with your pharmacy.

## 2019-10-30 NOTE — Telephone Encounter (Signed)
RX SENT

## 2019-12-07 ENCOUNTER — Telehealth (INDEPENDENT_AMBULATORY_CARE_PROVIDER_SITE_OTHER): Payer: 59 | Admitting: Family

## 2019-12-07 ENCOUNTER — Other Ambulatory Visit: Payer: Self-pay

## 2019-12-07 VITALS — Wt 175.0 lb

## 2019-12-07 DIAGNOSIS — F329 Major depressive disorder, single episode, unspecified: Secondary | ICD-10-CM

## 2019-12-07 DIAGNOSIS — F32A Depression, unspecified: Secondary | ICD-10-CM

## 2019-12-07 DIAGNOSIS — R635 Abnormal weight gain: Secondary | ICD-10-CM

## 2019-12-07 NOTE — Progress Notes (Signed)
Virtual Visit via Video Note  I connected with Virginia Ayala on 12/07/19 at 12:40 PM EDT by a video enabled telemedicine application and verified that I am speaking with the correct person using two identifiers.  Location: Patient: home Provider: work   I discussed the limitations of evaluation and management by telemedicine and the availability of in person appointments. The patient expressed understanding and agreed to proceed. Only the patient and myself were present for today's video call.   History of Present Illness:  Patient is a 49 yr old female who presents today to discuss weight gain.  She reports typical meals as below:  Breakfast- banana, or a bagel or a muffin.  Water  Lunch- dinner leftovers.   Dinner- home cooked. Typically healthy.  Sometimes snacks in the evenings.   Not exercising much due to "collapsed disc in my back."   Weight was 168 back 11/20. Wt Readings from Last 3 Encounters:  12/07/19 175 lb (79.4 kg)  10/17/19 175 lb (79.4 kg)  06/23/19 172 lb (78 kg)   Depression- mood is good, feels more motivated.      Observations/Objective:   Gen: Awake, alert, no acute distress Resp: Breathing is even and non-labored Psych: calm/pleasant demeanor Neuro: Alert and Oriented x 3, + facial symmetry, speech is clear.   Assessment and Plan:  Weight gain- I think her weight gain is due to her diet and lack of exercise. We discussed healthy diet and importance of 3 well balanced meals and regular exercise.  Walking, recumbent bike and swimming are all options for her that should not exacerbate her back. TSH was normal back in December.  Lab Results  Component Value Date   TSH 0.87 06/23/2019   Depression- stable on current medications. Continue same. Advised pt that I don't think wellbutrin is cause for her weight gain. We discussed that lexapro can be associated with weight gain, but she reports she did not notice any weight gain until after she began  wellbutrin.  (see above).   Follow Up Instructions:    I discussed the assessment and treatment plan with the patient. The patient was provided an opportunity to ask questions and all were answered. The patient agreed with the plan and demonstrated an understanding of the instructions.   The patient was advised to call back or seek an in-person evaluation if the symptoms worsen or if the condition fails to improve as anticipated.  Lemont Fillers, NP

## 2019-12-15 ENCOUNTER — Other Ambulatory Visit: Payer: Self-pay | Admitting: Family

## 2019-12-17 ENCOUNTER — Other Ambulatory Visit: Payer: Self-pay | Admitting: Family

## 2019-12-17 MED ORDER — ALPRAZOLAM 0.5 MG PO TABS: 0.5000 mg | ORAL_TABLET | Freq: Every evening | ORAL | 0 refills | Status: DC | PRN

## 2020-02-18 ENCOUNTER — Other Ambulatory Visit: Payer: Self-pay | Admitting: Family

## 2020-07-11 ENCOUNTER — Other Ambulatory Visit: Payer: Self-pay | Admitting: Family

## 2020-07-12 NOTE — Telephone Encounter (Signed)
Requesting:alprazolam 0.5 mg tablet Contract:05/20/19 UDS:05/20/19 Last Visit:12/07/2019 Next Visit: Not one scheduled Last Refill:12/17/19  Please Advise

## 2020-09-15 ENCOUNTER — Other Ambulatory Visit: Payer: Self-pay

## 2020-09-15 ENCOUNTER — Other Ambulatory Visit: Payer: Self-pay | Admitting: Family

## 2020-09-15 NOTE — Telephone Encounter (Signed)
Please advise when patient needs to return for follow up

## 2020-09-16 NOTE — Telephone Encounter (Signed)
She is due for follow up please.  

## 2020-09-18 ENCOUNTER — Other Ambulatory Visit: Payer: Self-pay | Admitting: Family

## 2020-09-19 ENCOUNTER — Encounter: Payer: Self-pay | Admitting: Family

## 2020-09-20 ENCOUNTER — Ambulatory Visit: Payer: No Typology Code available for payment source | Admitting: Family

## 2020-09-20 ENCOUNTER — Other Ambulatory Visit: Payer: Self-pay

## 2020-09-20 ENCOUNTER — Encounter: Payer: Self-pay | Admitting: Family

## 2020-09-20 VITALS — BP 115/75 | HR 75 | Temp 98.4°F | Resp 16 | Ht 65.0 in | Wt 181.0 lb

## 2020-09-20 DIAGNOSIS — F32A Depression, unspecified: Secondary | ICD-10-CM

## 2020-09-20 DIAGNOSIS — R635 Abnormal weight gain: Secondary | ICD-10-CM

## 2020-09-20 DIAGNOSIS — F419 Anxiety disorder, unspecified: Secondary | ICD-10-CM | POA: Diagnosis not present

## 2020-09-20 MED ORDER — BUPROPION HCL ER (XL) 150 MG PO TB24: 150.0000 mg | ORAL_TABLET | Freq: Every day | ORAL | 1 refills | Status: DC

## 2020-09-20 MED ORDER — ESCITALOPRAM OXALATE 20 MG PO TABS: 20.0000 mg | ORAL_TABLET | Freq: Every day | ORAL | 1 refills | Status: DC

## 2020-09-20 NOTE — Progress Notes (Signed)
Subjective:    Patient ID: Virginia Ayala, female    DOB: 09-Nov-1970, 50 y.o.   MRN: [id number]  HPI  Patient is a 50 yr old female who presents today for follow up.  Anxiety/Depression- she on lexapro 20mg  once daily.  She has xanax 0.5mg  on hand for prn use. She is having a lot of stress as her daughter was diagnosed with Crohn's and is not wanting to go to school. She stopped her wellbutrin because she did not think it was helping.  Weight gain- No formal exercise.  Notes that her back is a little bit better. She uses ibuprofen prn for back pain.  She uses xanax prn.    She has been on lexapro for 7 years.   Diet is "normalish"  Enjoys snacking.  No formal exercise.   Wt Readings from Last 3 Encounters:  09/20/20 181 lb (82.1 kg)  12/07/19 175 lb (79.4 kg)  10/17/19 175 lb (79.4 kg)   Depression screen [facility] 2/9 07/27/2019 05/20/2019  Decreased Interest 1 3  Down, Depressed, Hopeless 1 1  PHQ - 2 Score 2 4  Altered sleeping 0 3  Tired, decreased energy 1 3  Change in appetite 0 2  Feeling bad or failure about yourself  1 3  Trouble concentrating 0 0  Moving slowly or fidgety/restless 0 0  Suicidal thoughts 0 0  PHQ-9 Score 4 15  Difficult doing work/chores Not difficult at all Somewhat difficult   Reports that her lmp was about 2 yrs ago.    Review of Systems    see HPI  Past Medical History:  Diagnosis Date  . Anxiety   . 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 Depression   . Depression 06/23/2019     Social History   Socioeconomic History  . Marital status: Divorced    Spouse name: Not on file  . Number of children: Not on file  . Years of education: Not on file  . Highest education level: Not on file  Occupational History  . Not on file  Tobacco Use  . Smoking status: Never Smoker  . Smokeless tobacco: Never Used  Vaping Use  . Vaping Use: Never used  Substance and Sexual Activity  . Alcohol use: Not Currently  . Drug use: Never  .  Sexual activity: Not Currently  Other Topics Concern  . Not on file  Social History Narrative   Works in 06/25/2019- works from home    2 daughters   Divorced   2 dogs (Aeronautical engineer)    Social Determinants of Runner, broadcasting/film/video Strain: Not on Corporate investment banker Insecurity: Not on file  Transportation Needs: Not on file  Physical Activity: Not on file  Stress: Not on file  Social Connections: Not on file  Intimate Partner Violence: Not on file    Past Surgical History:  Procedure Laterality Date  . MOUTH SURGERY     in her 49's  . ORTHOPEDIC SURGERY Left 1980    Family History  Problem Relation Age of Onset  . Diabetes Father   . Pulmonary embolism Father   . Cancer Maternal Grandmother   . Heart disease Maternal Grandmother   . Heart disease Paternal Grandfather   . Pulmonary embolism Paternal Aunt     Allergies  Allergen Reactions  . Sulfa Antibiotics Shortness Of Breath  . Lodine [Etodolac] Itching  . Penicillins Rash    Current Outpatient Medications on  File Prior to Visit  Medication Sig Dispense Refill  . ALPRAZolam (XANAX) 0.5 MG tablet TAKE 1 TABLET(0.5 MG) BY MOUTH AT BEDTIME AS NEEDED FOR ANXIETY 30 tablet 0  . escitalopram (LEXAPRO) 20 MG tablet Take 1 tablet (20 mg total) by mouth daily. 15 tablet 0   No current facility-administered medications on file prior to visit.    BP 115/75 (BP Location: Right Arm, Patient Position: Sitting, Cuff Size: Small)   Pulse 75   Temp 98.4 F (36.9 C) (Oral)   Resp 16   Ht 5\' 5"  (1.651 m)   Wt 181 lb (82.1 kg)   SpO2 99%   BMI 30.12 kg/m    Objective:   Physical Exam Constitutional:      Appearance: Normal appearance.  HENT:     Head: Normocephalic and atraumatic.  Neurological:     Mental Status: She is alert and oriented to person, place, and time.  Psychiatric:        Attention and Perception: Attention normal.        Mood and Affect: Mood is depressed (mildly depressed mood).         Speech: Speech normal.        Behavior: Behavior normal.        Cognition and Memory: Cognition normal.        Judgment: Judgment normal.           Assessment & Plan:  Anxiety/depression- uncontrolled. Looking back at her PHQ-9, she was doing much better with  Her depression when she was taking the wellbutrin. Will restart at 150mg  xl.  Weight gain- recommended 30 minutes of exercise 5 days a week. Limit calories to 1300-1500/day.  30 minutes spent on today's visit. The majority of this time was spent counseling the patient.  This visit occurred during the SARS-CoV-2 public health emergency.  Safety protocols were in place, including screening questions prior to the visit, additional usage of staff PPE, and extensive cleaning of exam room while observing appropriate contact time as indicated for disinfecting solutions.

## 2020-09-20 NOTE — Telephone Encounter (Signed)
Patient has appointment today

## 2020-09-20 NOTE — Patient Instructions (Signed)
Please start wellbutrin xl 150mg  once daily.

## 2020-09-22 LAB — DRUG MONITORING, PANEL 8 WITH CONFIRMATION, URINE
6 Acetylmorphine: NEGATIVE ng/mL (ref <10)
Alcohol Metabolites: NEGATIVE ng/mL
Alphahydroxyalprazolam: 62 ng/mL — ABNORMAL HIGH (ref <25)
Alphahydroxymidazolam: NEGATIVE ng/mL (ref <50)
Alphahydroxytriazolam: NEGATIVE ng/mL (ref <50)
Aminoclonazepam: NEGATIVE ng/mL (ref <25)
Amphetamines: NEGATIVE ng/mL (ref <500)
Benzodiazepines: POSITIVE ng/mL — AB (ref <100)
Buprenorphine, Urine: NEGATIVE ng/mL (ref <5)
Cocaine Metabolite: NEGATIVE ng/mL (ref <150)
Creatinine: 70.3 mg/dL
Hydroxyethylflurazepam: NEGATIVE ng/mL (ref <50)
Lorazepam: NEGATIVE ng/mL (ref <50)
MDMA: NEGATIVE ng/mL (ref <500)
Marijuana Metabolite: NEGATIVE ng/mL (ref <20)
Nordiazepam: NEGATIVE ng/mL (ref <50)
Opiates: NEGATIVE ng/mL (ref <100)
Oxazepam: NEGATIVE ng/mL (ref <50)
Oxidant: NEGATIVE ug/mL
Oxycodone: NEGATIVE ng/mL (ref <100)
Temazepam: NEGATIVE ng/mL (ref <50)
pH: 6.4 (ref 4.5–9.0)

## 2020-09-22 LAB — DM TEMPLATE

## 2020-10-18 ENCOUNTER — Encounter: Payer: No Typology Code available for payment source | Admitting: Family

## 2020-10-19 ENCOUNTER — Encounter: Payer: No Typology Code available for payment source | Admitting: Family

## 2020-10-25 ENCOUNTER — Other Ambulatory Visit: Payer: Self-pay | Admitting: Family

## 2020-10-25 NOTE — Telephone Encounter (Signed)
Requesting: alprazolam Contract:  09/20/20 UDS: 09/20/20 Last Visit: 09/20/20 Next Visit: none Last Refill: 07/12/20  Please Advise

## 2020-12-05 ENCOUNTER — Other Ambulatory Visit: Payer: Self-pay | Admitting: Family

## 2020-12-05 NOTE — Telephone Encounter (Signed)
Last RX:10-25-2020 Last OV:09-20-2020 Next OV:12-07-2020 UDS:05-20-2019 CSC:09-30-2020

## 2020-12-07 ENCOUNTER — Ambulatory Visit (INDEPENDENT_AMBULATORY_CARE_PROVIDER_SITE_OTHER): Payer: No Typology Code available for payment source | Admitting: Family

## 2020-12-07 ENCOUNTER — Other Ambulatory Visit: Payer: Self-pay

## 2020-12-07 ENCOUNTER — Other Ambulatory Visit (HOSPITAL_COMMUNITY)
Admission: RE | Admit: 2020-12-07 | Discharge: 2020-12-07 | Disposition: A | Discharge status: Home / Self Care | Payer: No Typology Code available for payment source | Source: Ambulatory Visit | Attending: Family | Admitting: Family

## 2020-12-07 ENCOUNTER — Encounter: Payer: Self-pay | Admitting: Family

## 2020-12-07 VITALS — BP 123/90 | HR 88 | Temp 98.8°F | Resp 16 | Ht 65.0 in | Wt 185.0 lb

## 2020-12-07 DIAGNOSIS — R635 Abnormal weight gain: Secondary | ICD-10-CM

## 2020-12-07 DIAGNOSIS — Z0000 Encounter for general adult medical examination without abnormal findings: Secondary | ICD-10-CM

## 2020-12-07 DIAGNOSIS — Z01419 Encounter for gynecological examination (general) (routine) without abnormal findings: Secondary | ICD-10-CM | POA: Insufficient documentation

## 2020-12-07 DIAGNOSIS — Z23 Encounter for immunization: Secondary | ICD-10-CM | POA: Diagnosis not present

## 2020-12-07 DIAGNOSIS — F32A Depression, unspecified: Secondary | ICD-10-CM

## 2020-12-07 DIAGNOSIS — E785 Hyperlipidemia, unspecified: Secondary | ICD-10-CM

## 2020-12-07 MED ORDER — NYSTATIN 100000 UNIT/GM EX POWD: 1.0000 "application " | Freq: Two times a day (BID) | CUTANEOUS | 0 refills | Status: DC

## 2020-12-07 NOTE — Progress Notes (Signed)
Subjective:   By signing my name below, I, Shehryar Baig, attest that this documentation has been prepared under the direction and in the presence of Debbrah Alar NP. 12/07/2020     Patient ID: Virginia Ayala, female    DOB: [birth year], 50 y.o.   MRN: [id number]  Chief Complaint  Patient presents with   Annual Exam    HPI  Patient is in today for a comprehensive physical exam. She has had no surgeries in the last year. Her daughter has a history of Crohn's disease, otherwise there is no changes to her family history this past year. She does not smoke, drink, use drugs. She works from home at this time. She denies having any Negative for hearing loss and rhinorrhea, Negative for visual disturbance, Negative for cough, Negative for chest pain and leg swelling, Negative for nausea, vomiting, diarrhea and blood in stool, Negative for dysuria and frequency, Negative for myalgias and arthralgias, Negative for rash, Negative for headaches, Negative for adenopathy, Denies depression or anxiety at this time.  Anxiety/Depression- She notes slight improvement since her last visit.  Immunizations: She is UTD tetanus vaccines. She has 2 moderna Covid-19 vaccines. She is due for the shingles vaccine and is willing to get it. She is not interested in HIV or hepatitis C screening.  Diet: She does not control her diet at this time. She has gained weight recently. She notes that she gains weight while on 150 mg Wellbutrin daily PO and stopped her weight gain when she stopped taking it.  Exercise: She participates in exercise by walking her dogs and walking to her mothers house in the same neighborhood. She does not count calories at this time. She is planning to cut out snacks.  Colonoscopy: Due. Dexa: Not yet completed. Pap Smear: Not recently completed. Mammogram: Last completed 07/27/2019. Results normal. Repeat in 1 year.  Dental: She is UTD on dental care. She had a root canal during her last  dental visit.  Vision: She is not UTD on vision care.  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   Depression     Past Surgical History:  Procedure Laterality Date   MOUTH SURGERY     in her 89's  ORTHOPEDIC SURGERY Left 1980    Family History  Problem Relation Age of Onset   Diabetes Father    Pulmonary embolism Father    Cancer Maternal Grandmother    Heart disease Maternal Grandmother    Heart disease Paternal Grandfather    Crohn's disease Daughter    Pulmonary embolism Paternal Aunt     Social History   Socioeconomic History   Marital status: Divorced    Spouse name: Not on file   Number of children: Not on file   Years of education: Not on file   Highest education level: Not on file  Occupational History   Not on file  Tobacco Use   Smoking status: Never   Smokeless tobacco: Never  Vaping Use   Vaping Use: Never used  Substance and Sexual Activity   Alcohol use: Not Currently   Drug use: Never   Sexual activity: Not Currently  Other Topics  Concern   Not on file  Social History Narrative   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 90 tablet 1   escitalopram (LEXAPRO) 20 MG tablet Take 1 tablet (20 mg total) by mouth daily. 90 tablet 1   No facility-administered medications prior to visit.    Allergies  Allergen Reactions   Sulfa Antibiotics Shortness Of Breath   Lodine [Etodolac] Itching   Penicillins Rash    ROS    Review of Systems  HENT: Negative for hearing loss and rhinorrhea.   Eyes: Negative for visual disturbance.  Respiratory: Negative for cough.   Cardiovascular: Negative for chest pain and leg swelling.  Gastrointestinal: Negative for nausea, vomiting, diarrhea and blood in stool.  Genitourinary: Negative for dysuria and frequency.  Musculoskeletal: Negative for myalgias and arthralgias.  Skin: Negative for rash.  Neurological: Negative for headaches.  Hematological: Negative for adenopathy.  Psychiatric/Behavioral: Denies depression or anxiety   Objective:    Physical Exam Exam conducted with a chaperone present.  Constitutional:      General: She is not in acute distress.    Appearance: Normal appearance. She is not ill-appearing.  HENT:     Head: Normocephalic and atraumatic.     Right Ear: External ear normal.     Left Ear: External ear normal.  Eyes:     Extraocular Movements: Extraocular movements intact.     Pupils: Pupils are equal, round, and reactive to light.     Comments: No nystagmus  Cardiovascular:     Rate and Rhythm: Normal rate and regular rhythm.     Pulses: Normal pulses.     Heart sounds: Normal heart sounds. No murmur heard.   No gallop.  Pulmonary:     Effort: Pulmonary effort is normal. No respiratory distress.     Breath sounds: Normal breath sounds. No wheezing, rhonchi or rales.  Chest:  Breasts:    Right: Normal. No mass. Skin change: Rash beneath breast.    Left: Normal. No mass. Skin change: Rash beneath breast. Abdominal:     General: Bowel sounds are normal. There is no distension.     Palpations:  Abdomen is soft. There is no mass.     Tenderness: There is no abdominal tenderness. There is no guarding or rebound.     Hernia: No hernia is present.  Genitourinary:    Exam position: Lithotomy position.     Pubic Area: No rash.      Vagina: Normal.     Cervix: Normal.     Uterus: Normal.      Adnexa: Right adnexa normal and left adnexa normal.  Musculoskeletal:     Comments: 5/5 strength in both upper and lower extremities  Skin:    General: Skin is warm and dry.  Neurological:  Mental Status: She is alert and oriented to person, place, and time.     Deep Tendon Reflexes:     Reflex Scores:      Patellar reflexes are 3+ on the right side and 3+ on the left side. Psychiatric:        Behavior: Behavior normal.    BP 123/90 (BP Location: Right Arm, Patient Position: Sitting, Cuff Size: Large)   Pulse 88   Temp 98.8 F (37.1 C) (Oral)   Resp 16   Ht 5' 5"  (1.651 m)   Wt 185 lb (83.9 kg)   SpO2 97%   BMI 30.79 kg/m  Wt Readings from Last 3 Encounters:  12/07/20 185 lb (83.9 kg)  09/20/20 181 lb (82.1 kg)  12/07/19 175 lb (79.4 kg)       Assessment & Plan:   Problem List Items Addressed This Visit       Unprioritized   Preventative health care - Primary    Wt Readings from Last 3 Encounters:  12/07/20 185 lb (83.9 kg)  09/20/20 181 lb (82.1 kg)  12/07/19 175 lb (79.4 kg)         Relevant Orders   Ambulatory referral to Gastroenterology   MM 3D SCREEN BREAST BILATERAL   Other Visit Diagnoses     Weight gain       Relevant Orders   TSH   Hyperlipidemia, unspecified hyperlipidemia type       Relevant Orders   Lipid panel   Comp Met (CMET)   Encounter for routine gynecological examination with Papanicolaou smear of cervix       Relevant Orders   Cytology - PAP( [place])        Meds ordered this encounter  Medications   nystatin (NYSTATIN) powder    Sig: Apply 1 application topically 2 (two) times daily.    Dispense:  60 g    Refill:  0     Order Specific Question:   Supervising Provider    Answer:   Penni Homans A [[id number]]    I, Debbrah Alar NP, personally preformed the services described in this documentation.  All medical record entries made by the scribe were at my direction and in my presence.  I have reviewed the chart and discharge instructions (if applicable) and agree that the record reflects my personal performance and is accurate and complete. 12/07/2020   I,Shehryar Baig,acting as a scribe for Nance Pear, NP.,have documented all relevant documentation on the behalf of Nance Pear, NP,as directed by  Nance Pear, NP while in the presence of Nance Pear, NP.   Nance Pear, NP

## 2020-12-07 NOTE — Patient Instructions (Addendum)
Please get a routine eye exam. Try to add regular cardio and strength training. Please get your covid booster.

## 2020-12-07 NOTE — Assessment & Plan Note (Addendum)
Wt Readings from Last 3 Encounters:  12/07/20 185 lb (83.9 kg)  09/20/20 181 lb (82.1 kg)  12/07/19 175 lb (79.4 kg)   Discussed healthy diet, exercise, weight loss.  Shingrix #1 today.

## 2020-12-08 LAB — COMPREHENSIVE METABOLIC PANEL
ALT: 27 U/L (ref 0–35)
AST: 21 U/L (ref 0–37)
Albumin: 4.4 g/dL (ref 3.5–5.2)
Alkaline Phosphatase: 106 U/L (ref 39–117)
BUN: 16 mg/dL (ref 6–23)
CO2: 28 mEq/L (ref 19–32)
Calcium: 9.7 mg/dL (ref 8.4–10.5)
Chloride: 101 mEq/L (ref 96–112)
Creatinine, Ser: 1.03 mg/dL (ref 0.40–1.20)
GFR: 63.43 mL/min (ref >60.00)
Glucose, Bld: 91 mg/dL (ref 70–99)
Potassium: 4.3 mEq/L (ref 3.5–5.1)
Sodium: 138 mEq/L (ref 135–145)
Total Bilirubin: 0.3 mg/dL (ref 0.2–1.2)
Total Protein: 7.2 g/dL (ref 6.0–8.3)

## 2020-12-08 LAB — LIPID PANEL
Cholesterol: 296 mg/dL — ABNORMAL HIGH (ref 0–200)
HDL: 55.3 mg/dL (ref >39.00)
LDL Cholesterol: 204 mg/dL — ABNORMAL HIGH (ref 0–99)
NonHDL: 240.7
Total CHOL/HDL Ratio: 5
Triglycerides: 183 mg/dL — ABNORMAL HIGH (ref 0.0–149.0)
VLDL: 36.6 mg/dL (ref 0.0–40.0)

## 2020-12-08 LAB — TSH: TSH: 1.25 u[IU]/mL (ref 0.35–4.50)

## 2020-12-08 NOTE — Assessment & Plan Note (Signed)
She reports that her mood is improved.  Continue current Wellbutrin xl 150 and lexapro 20mg .

## 2020-12-08 NOTE — Addendum Note (Signed)
Addended by: Nikeshia Keetch M on: 12/08/2020 11:50 AM   Modules accepted: Orders  

## 2020-12-09 LAB — CYTOLOGY - PAP
Comment: NEGATIVE
Diagnosis: NEGATIVE
High risk HPV: NEGATIVE

## 2020-12-12 ENCOUNTER — Other Ambulatory Visit: Payer: Self-pay

## 2020-12-12 ENCOUNTER — Ambulatory Visit (HOSPITAL_BASED_OUTPATIENT_CLINIC_OR_DEPARTMENT_OTHER)
Admission: RE | Admit: 2020-12-12 | Discharge: 2020-12-12 | Disposition: A | Discharge status: Home / Self Care | Payer: No Typology Code available for payment source | Source: Ambulatory Visit | Attending: Family | Admitting: Family

## 2020-12-12 ENCOUNTER — Encounter (HOSPITAL_BASED_OUTPATIENT_CLINIC_OR_DEPARTMENT_OTHER): Payer: Self-pay

## 2020-12-12 DIAGNOSIS — Z0000 Encounter for general adult medical examination without abnormal findings: Secondary | ICD-10-CM

## 2020-12-12 DIAGNOSIS — Z1231 Encounter for screening mammogram for malignant neoplasm of breast: Secondary | ICD-10-CM | POA: Insufficient documentation

## 2020-12-29 ENCOUNTER — Other Ambulatory Visit: Payer: Self-pay | Admitting: Family

## 2021-02-07 ENCOUNTER — Other Ambulatory Visit: Payer: Self-pay

## 2021-02-07 ENCOUNTER — Ambulatory Visit (INDEPENDENT_AMBULATORY_CARE_PROVIDER_SITE_OTHER): Payer: No Typology Code available for payment source

## 2021-02-07 DIAGNOSIS — Z23 Encounter for immunization: Secondary | ICD-10-CM | POA: Diagnosis not present

## 2021-02-07 NOTE — Progress Notes (Signed)
Patient here today for shingrix vaccine 0.5mL given in left deltoid IM. Patient tolerated well. VIS given. [facility] updated.  

## 2021-03-10 ENCOUNTER — Encounter: Payer: No Typology Code available for payment source | Admitting: Gastroenterology

## 2021-03-28 ENCOUNTER — Other Ambulatory Visit: Payer: Self-pay | Admitting: Family

## 2021-03-28 NOTE — Telephone Encounter (Signed)
Requesting: alprazolam 0.5mg   Contract: 05/20/2019 UDS: 09/20/2020 Last Visit: 12/07/2020 Next Visit: 06/09/21 Last Refill: 12/05/2020 #30 and 0RF  Please Advise

## 2021-04-27 ENCOUNTER — Other Ambulatory Visit: Payer: Self-pay | Admitting: Family

## 2021-06-05 ENCOUNTER — Other Ambulatory Visit: Payer: Self-pay

## 2021-06-05 ENCOUNTER — Other Ambulatory Visit (HOSPITAL_BASED_OUTPATIENT_CLINIC_OR_DEPARTMENT_OTHER): Payer: Self-pay

## 2021-06-05 ENCOUNTER — Emergency Department (HOSPITAL_BASED_OUTPATIENT_CLINIC_OR_DEPARTMENT_OTHER)
Admission: EM | Admit: 2021-06-05 | Discharge: 2021-06-05 | Disposition: A | Discharge status: Home / Self Care | Payer: No Typology Code available for payment source | Attending: Emergency Medicine | Admitting: Emergency Medicine

## 2021-06-05 ENCOUNTER — Encounter (HOSPITAL_BASED_OUTPATIENT_CLINIC_OR_DEPARTMENT_OTHER): Payer: Self-pay | Admitting: Emergency Medicine

## 2021-06-05 DIAGNOSIS — M5432 Sciatica, left side: Secondary | ICD-10-CM | POA: Diagnosis present

## 2021-06-05 MED ORDER — CYCLOBENZAPRINE HCL 10 MG PO TABS
10.0000 mg | ORAL_TABLET | Freq: Two times a day (BID) | ORAL | 0 refills | Status: DC | PRN
  Filled 2021-06-05: qty 20, 10d supply, fill #0

## 2021-06-05 MED ORDER — CYCLOBENZAPRINE HCL 10 MG PO TABS
10.0000 mg | ORAL_TABLET | Freq: Once | ORAL | Status: AC
  Administered 2021-06-05: 10 mg via ORAL
  Filled 2021-06-05: qty 1

## 2021-06-05 MED ORDER — METHYLPREDNISOLONE 4 MG PO TBPK
ORAL_TABLET | ORAL | 0 refills | Status: DC
  Filled 2021-06-05: qty 21, 6d supply, fill #0

## 2021-06-05 MED ORDER — ACETAMINOPHEN-CODEINE #3 300-30 MG PO TABS
1.0000 | ORAL_TABLET | Freq: Four times a day (QID) | ORAL | 0 refills | Status: DC | PRN
  Filled 2021-06-05: qty 10, 2d supply, fill #0

## 2021-06-05 NOTE — ED Triage Notes (Signed)
Pt arrives pov with c/o lower back pain x 2 days, endorses hx of same. Pt reports difficulty ambulating r/t pain

## 2021-06-05 NOTE — ED Notes (Signed)
Pt discharged to home. Discharge instructions have been discussed with patient and/or family members. Pt verbally acknowledges understanding d/c instructions, and endorses comprehension to checkout at registration before leaving.  °

## 2021-06-05 NOTE — Discharge Instructions (Addendum)
Recommend 800 mg ibuprofen every 8 hours as needed for pain.  Consider buying over-the-counter lidocaine patches.  Take Flexeril, tylenol 3 and Medrol , Dosepak as prescribed.

## 2021-06-05 NOTE — ED Provider Notes (Signed)
[facility] EMERGENCY DEPARTMENT Provider Note   CSN: [id number] Arrival date & time: 06/05/21  1020     History Chief Complaint  Patient presents with   Back Pain     Virginia Ayala is a 50 y.o. female.  The history is provided by the patient.  Back Pain Location:  Lumbar spine Quality:  Aching Radiates to: left leg. 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     Family History  Problem Relation Age of Onset   Diabetes Father    Pulmonary embolism Father    Cancer Maternal Grandmother    Heart disease Maternal Grandmother    Heart disease Paternal Grandfather    Crohn's disease Daughter    Pulmonary embolism Paternal Aunt     Social History   Tobacco Use   Smoking status: Never   Smokeless tobacco: Never  Vaping Use   Vaping Use: Never used  Substance Use Topics   Alcohol use: Not Currently   Drug use: Never    Home Medications Prior to Admission medications   Medication Sig Start Date End Date Taking? Authorizing Provider  acetaminophen-codeine (TYLENOL #3) 300-30 MG tablet Take 1-2 tablets by mouth every 6 (six) hours as needed for moderate  pain. 06/05/21  Yes Chermaine Schnyder, DO  cyclobenzaprine (FLEXERIL) 10 MG tablet Take 1 tablet (10 mg total) by mouth 2 (two) times daily as needed for muscle spasms. 06/05/21  Yes Modine Oppenheimer, DO  methylPREDNISolone (MEDROL DOSEPAK) 4 MG TBPK tablet Follow package insert 06/05/21  Yes Vishal Sandlin, DO  ALPRAZolam (XANAX) 0.5 MG tablet TAKE 1 TABLET(0.5 MG) BY MOUTH AT BEDTIME AS NEEDED FOR ANXIETY 03/28/21   Sandford Craze, NP  buPROPion (WELLBUTRIN XL) 150 MG 24 hr tablet TAKE 1 TABLET DAILY 12/30/20   Sandford Craze, NP  escitalopram (LEXAPRO) 20 MG tablet TAKE 1 TABLET DAILY 04/27/21   Sandford Craze, NP  nystatin (NYSTATIN) powder Apply 1 application topically 2 (two) times daily. 12/07/20   Sandford Craze, NP    Allergies    Sulfa antibiotics, Lodine [etodolac], and Penicillins  Review of Systems   Review of Systems  Constitutional:  Negative for fever and weight loss.  Cardiovascular:  Negative for chest pain.  Gastrointestinal:  Negative for abdominal pain and bowel incontinence.  Genitourinary:  Negative for bladder incontinence, dysuria and pelvic pain.  Musculoskeletal:  Positive for back pain.  Neurological:  Negative for tingling, weakness, numbness, headaches and paresthesias.   Physical Exam Updated Vital Signs BP 125/78 (BP Location: Left Arm)   Pulse 66   Temp 98 F (36.7  C) (Oral)   Resp 18   Ht 5\' 5"  (1.651 m)   Wt 81.6 kg   SpO2 100%   BMI 29.95 kg/m   Physical Exam Constitutional:      General: She is not in acute distress.    Appearance: She is not ill-appearing.  HENT:     Head: Normocephalic.     Nose: Nose normal.  Cardiovascular:     Pulses: Normal pulses.  Musculoskeletal:        General: Tenderness present. Normal range of motion.  Skin:    General: Skin is warm.  Neurological:     General: No focal deficit present.     Mental Status: She is alert.     Sensory: No sensory deficit.     Motor: No weakness.    ED Results /  Procedures / Treatments   Labs (all labs ordered are listed, but only abnormal results are displayed) Labs Reviewed - No data to display  EKG None  Radiology No results found.  Procedures Procedures   Medications Ordered in ED Medications  cyclobenzaprine (FLEXERIL) tablet 10 mg (10 mg Oral Given 06/05/21 1127)    ED Course  I have reviewed the triage vital signs and the nursing notes.  Pertinent labs & imaging results that were available during my care of the patient were reviewed by me and considered in my medical decision making (see chart for details).    MDM Rules/Calculators/A&P                            Britiny Claw is here with back pain.  Sciatic type pain.  Normal vitals.  No fever.  No symptoms to suggest cauda equina.  Normal strength and sensation throughout.  Pain mostly in the left lower lumbar area/gluteal area.  No midline spinal pain.  No concern for epidural infectious process.  Will prescribe Medrol Dosepak, Flexeril, Tylenol 3.  Recommend follow-up with primary care doctor.  Discharged in good condition.  Understands return precautions.  This chart was dictated using voice recognition software.  Despite best efforts to proofread,  errors can occur which can change the documentation meaning.   Final Clinical Impression(s) / ED Diagnoses Final diagnoses:  Sciatica of left side    Rx / DC Orders ED Discharge Orders          Ordered    methylPREDNISolone (MEDROL DOSEPAK) 4 MG TBPK tablet        06/05/21 1128    cyclobenzaprine (FLEXERIL) 10 MG tablet  2 times daily PRN        06/05/21 1128    acetaminophen-codeine (TYLENOL #3) 300-30 MG tablet  Every 6 hours PRN        06/05/21 1132             Lennice Sites, DO 06/05/21 1133

## 2021-06-09 ENCOUNTER — Ambulatory Visit (INDEPENDENT_AMBULATORY_CARE_PROVIDER_SITE_OTHER): Payer: No Typology Code available for payment source | Admitting: Family

## 2021-06-09 VITALS — BP 127/86 | HR 77 | Temp 97.6°F | Resp 16 | Ht 65.0 in | Wt 184.4 lb

## 2021-06-09 DIAGNOSIS — M5442 Lumbago with sciatica, left side: Secondary | ICD-10-CM | POA: Diagnosis not present

## 2021-06-09 DIAGNOSIS — F32A Depression, unspecified: Secondary | ICD-10-CM | POA: Diagnosis not present

## 2021-06-09 MED ORDER — TRAMADOL HCL 50 MG PO TABS: 50.0000 mg | ORAL_TABLET | Freq: Three times a day (TID) | ORAL | 0 refills | Status: AC | PRN

## 2021-06-09 NOTE — Progress Notes (Signed)
Subjective:     Patient ID: Virginia Ayala, female    DOB: 05-24-1971, 50 y.o.   MRN: [id number]  Chief Complaint  Patient presents with   Follow-up    Here for 6 Months Follow Up    HPI Patient is in today for  Sciatica- was recently seen in the ED.  Treated with medrol dose pack and tylenol #3.  Pain is slightly improved but still quite uncomfortable. She has one more day left of medrol and has completed tylenol #3.   Depression/anxiety- reports that her mood is fine. States that she is not sad any longer, just has no motivation.  She working from home doing [employer] for an [employer].    No counselor.   USes xanax rarely.        Health Maintenance Due  Topic Date Due   COLONOSCOPY (Pts 45-63yrs Insurance coverage will need to be confirmed)  Never done   COVID-19 Vaccine (3 - Booster for Moderna series) 02/19/2020    Past Medical History:  Diagnosis Date   Anxiety    Back pain    due to bulging disc lumbar spine   Depression     Past Surgical History:  Procedure Laterality Date   MOUTH SURGERY     in her 25's   ORTHOPEDIC SURGERY Left 1980    Family History  Problem Relation Age of Onset   Diabetes Father    Pulmonary embolism Father    Cancer Maternal Grandmother    Heart disease Maternal Grandmother    Heart disease Paternal Grandfather    Crohn's disease Daughter    Pulmonary embolism Paternal Aunt     Social History   Socioeconomic History   Marital status: Divorced    Spouse name: Not on file   Number of children: Not on file   Years of education: Not on file   Highest education level: Not on file  Occupational History   Not on file  Tobacco Use   Smoking status: Never   Smokeless tobacco: Never  Vaping Use   Vaping Use: Never used  Substance and Sexual Activity   Alcohol use: Not Currently   Drug use: Never   Sexual activity: Not Currently  Other Topics Concern   Not on file  Social History Narrative   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    2 daughters   Divorced   2 dogs (Contractor)    Social Determinants of Radio broadcast assistant Strain: Not on Art therapist Insecurity: Not on file  Transportation Needs: Not on file  Physical Activity: Not on file  Stress: Not on file  Social Connections: Not on file  Intimate Partner Violence: Not on file    Outpatient Medications Prior to Visit  Medication Sig Dispense Refill   ALPRAZolam (XANAX) 0.5 MG tablet TAKE 1 TABLET(0.5 MG) BY MOUTH AT BEDTIME AS NEEDED FOR ANXIETY 30 tablet 0   buPROPion (WELLBUTRIN XL) 150 MG 24 hr tablet TAKE 1 TABLET DAILY 90 tablet 1   cyclobenzaprine (FLEXERIL) 10 MG tablet Take 1 tablet (10 mg total) by mouth 2 (two) times daily as needed for muscle spasms. 20 tablet 0   escitalopram (LEXAPRO) 20 MG tablet TAKE 1 TABLET DAILY 90 tablet 0   methylPREDNISolone (MEDROL DOSEPAK) 4 MG TBPK tablet Take 6 tablets by mouth on day 1, then take 5 tablets on day 2, then 4 tablets on day 3, then 3 tablets on day 4, then 2  tablets on day 5, then 1 tablet on day 6. 21 each 0   nystatin (NYSTATIN) powder Apply 1 application topically 2 (two) times daily. 60 g 0   acetaminophen-codeine (TYLENOL #3) 300-30 MG tablet Take 1-2 tablets by mouth every 6 (six) hours as needed for moderate pain. 10 tablet 0   No facility-administered medications prior to visit.    Allergies  Allergen Reactions   Sulfa Antibiotics Shortness Of Breath   Lodine [Etodolac] Itching   Penicillins Rash    ROS    See HPI Objective:    Physical Exam Constitutional:      General: She is not in acute distress.    Appearance: Normal appearance. She is well-developed.  HENT:     Head: Normocephalic and atraumatic.     Right Ear: External ear normal.     Left Ear: External ear normal.  Eyes:     General: No scleral icterus. Neck:     Thyroid: No thyromegaly.  Cardiovascular:     Rate and Rhythm: Normal rate and regular rhythm.     Heart  sounds: Normal heart sounds. No murmur heard. Pulmonary:     Effort: Pulmonary effort is normal. No respiratory distress.     Breath sounds: Normal breath sounds. No wheezing.  Musculoskeletal:     Cervical back: Neck supple.  Skin:    General: Skin is warm and dry.  Neurological:     Mental Status: She is alert and oriented to person, place, and time.  Psychiatric:        Mood and Affect: Mood normal.        Behavior: Behavior normal.        Thought Content: Thought content normal.        Judgment: Judgment normal.    BP 127/86 (BP Location: Right Arm, Patient Position: Sitting, Cuff Size: Small)   Pulse 77   Temp 97.6 F (36.4 C) (Oral)   Resp 16   Ht 5\' 5"  (1.651 m)   Wt 184 lb 6.4 oz (83.6 kg)   SpO2 99%   BMI 30.69 kg/m  Wt Readings from Last 3 Encounters:  06/09/21 184 lb 6.4 oz (83.6 kg)  06/05/21 180 lb (81.6 kg)  12/07/20 185 lb (83.9 kg)       Assessment & Plan:   Problem List Items Addressed This Visit       Unprioritized   Depression    Overall stable. Has some situational stressors with her job.  Continue current dose of wellbutrin and lexapro.  We did discuss counseling- she will think about it.       Acute left-sided low back pain with left-sided sciatica - Primary    Uncontrolled. Complete medrol pak. Rx provided for short course of prn tramadol.       Relevant Medications   traMADol (ULTRAM) 50 MG tablet    I have discontinued Virginia Ayala's acetaminophen-codeine. I am also having her start on traMADol. Additionally, I am having her maintain her nystatin, buPROPion, ALPRAZolam, escitalopram, methylPREDNISolone, and cyclobenzaprine.  Meds ordered this encounter  Medications   traMADol (ULTRAM) 50 MG tablet    Sig: Take 1 tablet (50 mg total) by mouth every 8 (eight) hours as needed for up to 5 days.    Dispense:  15 tablet    Refill:  0    Order Specific Question:   Supervising Provider    Answer:   02/06/21 A [[id number]]

## 2021-06-10 DIAGNOSIS — M5442 Lumbago with sciatica, left side: Secondary | ICD-10-CM | POA: Insufficient documentation

## 2021-06-10 DIAGNOSIS — M549 Dorsalgia, unspecified: Secondary | ICD-10-CM | POA: Insufficient documentation

## 2021-06-10 NOTE — Assessment & Plan Note (Addendum)
Overall stable. Has some situational stressors with her job.  Continue current dose of wellbutrin and lexapro.  We did discuss counseling- she will think about it.

## 2021-06-10 NOTE — Assessment & Plan Note (Signed)
Uncontrolled. Complete medrol pak. Rx provided for short course of prn tramadol.

## 2021-07-12 ENCOUNTER — Other Ambulatory Visit: Payer: Self-pay

## 2021-07-12 ENCOUNTER — Emergency Department (HOSPITAL_BASED_OUTPATIENT_CLINIC_OR_DEPARTMENT_OTHER)
Admission: EM | Admit: 2021-07-12 | Discharge: 2021-07-12 | Disposition: A | Discharge status: Home / Self Care | Payer: No Typology Code available for payment source | Attending: Emergency Medicine | Admitting: Emergency Medicine

## 2021-07-12 ENCOUNTER — Encounter (HOSPITAL_BASED_OUTPATIENT_CLINIC_OR_DEPARTMENT_OTHER): Payer: Self-pay | Admitting: *Deleted

## 2021-07-12 DIAGNOSIS — R000 Tachycardia, unspecified: Secondary | ICD-10-CM | POA: Diagnosis not present

## 2021-07-12 DIAGNOSIS — R519 Headache, unspecified: Secondary | ICD-10-CM | POA: Insufficient documentation

## 2021-07-12 DIAGNOSIS — Z20822 Contact with and (suspected) exposure to covid-19: Secondary | ICD-10-CM | POA: Diagnosis not present

## 2021-07-12 DIAGNOSIS — R112 Nausea with vomiting, unspecified: Secondary | ICD-10-CM | POA: Insufficient documentation

## 2021-07-12 DIAGNOSIS — R6883 Chills (without fever): Secondary | ICD-10-CM | POA: Insufficient documentation

## 2021-07-12 LAB — URINALYSIS, MICROSCOPIC (REFLEX)

## 2021-07-12 LAB — CBC WITH DIFFERENTIAL/PLATELET
Abs Immature Granulocytes: 0.03 10*3/uL (ref 0.00–0.07)
Basophils Absolute: 0 10*3/uL (ref 0.0–0.1)
Basophils Relative: 0 %
Eosinophils Absolute: 0 10*3/uL (ref 0.0–0.5)
Eosinophils Relative: 0 %
HCT: 46.1 % — ABNORMAL HIGH (ref 36.0–46.0)
Hemoglobin: 15.9 g/dL — ABNORMAL HIGH (ref 12.0–15.0)
Immature Granulocytes: 0 %
Lymphocytes Relative: 5 %
Lymphs Abs: 0.5 10*3/uL — ABNORMAL LOW (ref 0.7–4.0)
MCH: 29.9 pg (ref 26.0–34.0)
MCHC: 34.5 g/dL (ref 30.0–36.0)
MCV: 86.7 fL (ref 80.0–100.0)
Monocytes Absolute: 0.5 10*3/uL (ref 0.1–1.0)
Monocytes Relative: 5 %
Neutro Abs: 7.9 10*3/uL — ABNORMAL HIGH (ref 1.7–7.7)
Neutrophils Relative %: 90 %
Platelets: 279 10*3/uL (ref 150–400)
RBC: 5.32 MIL/uL — ABNORMAL HIGH (ref 3.87–5.11)
RDW: 13.1 % (ref 11.5–15.5)
WBC: 8.9 10*3/uL (ref 4.0–10.5)
nRBC: 0 % (ref 0.0–0.2)

## 2021-07-12 LAB — URINALYSIS, ROUTINE W REFLEX MICROSCOPIC
Bilirubin Urine: NEGATIVE
Glucose, UA: NEGATIVE mg/dL
Hgb urine dipstick: NEGATIVE
Ketones, ur: NEGATIVE mg/dL
Nitrite: NEGATIVE
Protein, ur: 30 mg/dL — AB
Specific Gravity, Urine: 1.02 (ref 1.005–1.030)
pH: 8.5 — ABNORMAL HIGH (ref 5.0–8.0)

## 2021-07-12 LAB — COMPREHENSIVE METABOLIC PANEL
ALT: 33 U/L (ref 0–44)
AST: 28 U/L (ref 15–41)
Albumin: 4.1 g/dL (ref 3.5–5.0)
Alkaline Phosphatase: 106 U/L (ref 38–126)
Anion gap: 11 (ref 5–15)
BUN: 16 mg/dL (ref 6–20)
CO2: 21 mmol/L — ABNORMAL LOW (ref 22–32)
Calcium: 9.2 mg/dL (ref 8.9–10.3)
Chloride: 102 mmol/L (ref 98–111)
Creatinine, Ser: 0.88 mg/dL (ref 0.44–1.00)
GFR, Estimated: >60 mL/min (ref >60)
Glucose, Bld: 122 mg/dL — ABNORMAL HIGH (ref 70–99)
Potassium: 3.7 mmol/L (ref 3.5–5.1)
Sodium: 134 mmol/L — ABNORMAL LOW (ref 135–145)
Total Bilirubin: 0.5 mg/dL (ref 0.3–1.2)
Total Protein: 8.2 g/dL — ABNORMAL HIGH (ref 6.5–8.1)

## 2021-07-12 LAB — RESP PANEL BY RT-PCR (FLU A&B, COVID) ARPGX2
Influenza A by PCR: NEGATIVE
Influenza B by PCR: NEGATIVE
SARS Coronavirus 2 by RT PCR: NEGATIVE

## 2021-07-12 LAB — LIPASE, BLOOD: Lipase: 36 U/L (ref 11–51)

## 2021-07-12 MED ORDER — FAMOTIDINE IN NACL 20-0.9 MG/50ML-% IV SOLN
20.0000 mg | Freq: Once | INTRAVENOUS | Status: AC
Start: 2021-07-12 — End: 2021-07-12
  Administered 2021-07-12: 20 mg via INTRAVENOUS
  Filled 2021-07-12: qty 50

## 2021-07-12 MED ORDER — ONDANSETRON 4 MG PO TBDP: 4.0000 mg | ORAL_TABLET | Freq: Three times a day (TID) | ORAL | 0 refills | Status: DC | PRN

## 2021-07-12 MED ORDER — SODIUM CHLORIDE 0.9 % IV BOLUS
1000.0000 mL | Freq: Once | INTRAVENOUS | Status: AC
  Administered 2021-07-12: 1000 mL via INTRAVENOUS

## 2021-07-12 MED ORDER — METOCLOPRAMIDE HCL 5 MG/ML IJ SOLN
10.0000 mg | Freq: Once | INTRAMUSCULAR | Status: AC
Start: 2021-07-12 — End: 2021-07-12
  Administered 2021-07-12: 10 mg via INTRAVENOUS
  Filled 2021-07-12: qty 2

## 2021-07-12 MED ORDER — ONDANSETRON HCL 4 MG/2ML IJ SOLN
4.0000 mg | Freq: Once | INTRAMUSCULAR | Status: AC
  Administered 2021-07-12: 4 mg via INTRAVENOUS
  Filled 2021-07-12: qty 2

## 2021-07-12 NOTE — ED Provider Notes (Signed)
[facility] EMERGENCY DEPARTMENT Provider Note   CSN: [id number] Arrival date & time: 07/12/21  1521     History  Chief Complaint  Patient presents with   Vomiting    Virginia Ayala is a 51 y.o. female.  Patient developed nausea with vomiting last night. Occasional chills and headache. No recent sick contacts. No abdominal pain other than with active vomiting. Persistent nausea.  The history is provided by the patient. No language interpreter was used.  Emesis Severity:  Moderate Duration:  14 hours Timing:  Intermittent Progression:  Unchanged Chronicity:  New Associated symptoms: chills and headaches   Associated symptoms: no abdominal pain       Home Medications Prior to Admission medications   Medication Sig Start Date End Date Taking? Authorizing Provider  ALPRAZolam (XANAX) 0.5 MG tablet TAKE 1 TABLET(0.5 MG) BY MOUTH AT BEDTIME AS NEEDED FOR ANXIETY 03/28/21   Sandford Craze, NP  buPROPion (WELLBUTRIN XL) 150 MG 24 hr tablet TAKE 1 TABLET DAILY 12/30/20   Sandford Craze, NP  cyclobenzaprine (FLEXERIL) 10 MG tablet Take 1 tablet (10 mg total) by mouth 2 (two) times daily as needed for muscle spasms. 06/05/21   Curatolo, Adam, DO  escitalopram (LEXAPRO) 20 MG tablet TAKE 1 TABLET DAILY 04/27/21   Sandford Craze, NP  methylPREDNISolone (MEDROL DOSEPAK) 4 MG TBPK tablet Take 6 tablets by mouth on day 1, then take 5 tablets on day 2, then 4 tablets on day 3, then 3 tablets on day 4, then 2 tablets on day 5, then 1 tablet on day 6. 06/05/21   Curatolo, Adam, DO  nystatin (NYSTATIN) powder Apply 1 application topically 2 (two) times daily. 12/07/20   Sandford Craze, NP      Allergies    Sulfa antibiotics, Lodine [etodolac], and Penicillins    Review of Systems   Review of Systems  Constitutional:  Positive for chills.  Gastrointestinal:  Positive for nausea and vomiting. Negative for abdominal pain.  Neurological:  Positive for headaches.  All  other systems reviewed and are negative.  Physical Exam Updated Vital Signs BP (!) 123/96 (BP Location: Right Arm)    Pulse (!) 123    Temp 98.6 F (37 C) (Oral)    Resp 18    Ht 5\' 5"  (1.651 m)    Wt 81.6 kg    LMP 08/31/2018    SpO2 96%    BMI 29.95 kg/m  Physical Exam Vitals and nursing note reviewed.  Constitutional:      Appearance: Normal appearance.  HENT:     Head: Normocephalic.     Nose: Nose normal.  Eyes:     Conjunctiva/sclera: Conjunctivae normal.  Cardiovascular:     Rate and Rhythm: Tachycardia present.  Pulmonary:     Effort: Pulmonary effort is normal.  Abdominal:     Palpations: Abdomen is soft.  Musculoskeletal:        General: Normal range of motion.  Skin:    General: Skin is warm and dry.  Neurological:     Mental Status: She is alert and oriented to person, place, and time.  Psychiatric:        Mood and Affect: Mood normal.        Behavior: Behavior normal.    ED Results / Procedures / Treatments   Labs (all labs ordered are listed, but only abnormal results are displayed) Labs Reviewed  CBC WITH DIFFERENTIAL/PLATELET - Abnormal; Notable for the following components:      Result Value  RBC 5.32 (*)    Hemoglobin 15.9 (*)    HCT 46.1 (*)    Neutro Abs 7.9 (*)    Lymphs Abs 0.5 (*)    All other components within normal limits  COMPREHENSIVE METABOLIC PANEL - Abnormal; Notable for the following components:   Sodium 134 (*)    CO2 21 (*)    Glucose, Bld 122 (*)    Total Protein 8.2 (*)    All other components within normal limits  URINALYSIS, ROUTINE W REFLEX MICROSCOPIC - Abnormal; Notable for the following components:   pH 8.5 (*)    Protein, ur 30 (*)    Leukocytes,Ua SMALL (*)    All other components within normal limits  URINALYSIS, MICROSCOPIC (REFLEX) - Abnormal; Notable for the following components:   Bacteria, UA RARE (*)    Non Squamous Epithelial PRESENT (*)    All other components within normal limits  RESP PANEL BY RT-PCR  (FLU A&B, COVID) ARPGX2  LIPASE, BLOOD    EKG None  Radiology No results found.  Procedures Procedures    Medications Ordered in ED Medications  sodium chloride 0.9 % bolus 1,000 mL (0 mLs Intravenous Stopped 07/12/21 1900)  ondansetron (ZOFRAN) injection 4 mg (4 mg Intravenous Given 07/12/21 1759)  famotidine (PEPCID) IVPB 20 mg premix (0 mg Intravenous Stopped 07/12/21 1831)  metoCLOPramide (REGLAN) injection 10 mg (10 mg Intravenous Given 07/12/21 1830)    ED Course/ Medical Decision Making/ A&P                           Medical Decision Making  Patient is nontoxic, nonseptic appearing, in no apparent distress.  Patient's symptoms adequately managed in emergency department.  Fluid bolus given.  Labs and vitals reviewed, consistent with mild dehydration.  Patient does not meet the SIRS or Sepsis criteria.  Patient discharged home with symptomatic treatment and given strict instructions for follow-up with their primary care physician.  I have also discussed reasons to return immediately to the ER.  Patient expresses understanding and agrees with plan.          Final Clinical Impression(s) / ED Diagnoses Final diagnoses:  Nausea and vomiting, unspecified vomiting type    Rx / DC Orders ED Discharge Orders          Ordered    ondansetron (ZOFRAN-ODT) 4 MG disintegrating tablet  Every 8 hours PRN,   Status:  Discontinued        07/12/21 1933    ondansetron (ZOFRAN-ODT) 4 MG disintegrating tablet  Every 8 hours PRN        07/12/21 1934              Felicie Morn, NP 07/12/21 2329    Charlynne Pander, MD 07/17/21 1721

## 2021-07-12 NOTE — Discharge Instructions (Addendum)
Please follow up with your primary care provider.

## 2021-07-12 NOTE — ED Notes (Signed)
D/c paperwork reviewed with pt, including prescription and f/u care. No questions or concerns at time of d/c. Ambulatory to ED exit with visitor.

## 2021-07-12 NOTE — ED Triage Notes (Signed)
C/o n/v , chills, h/a  x 14 hrs

## 2021-07-12 NOTE — ED Notes (Signed)
Pt tolerating PO intake at this time. Will continue to monitor.

## 2021-07-27 ENCOUNTER — Other Ambulatory Visit: Payer: Self-pay | Admitting: Family

## 2021-08-21 ENCOUNTER — Telehealth: Payer: Self-pay | Admitting: Family

## 2021-09-01 ENCOUNTER — Other Ambulatory Visit: Payer: Self-pay | Admitting: Family

## 2021-09-01 NOTE — Telephone Encounter (Signed)
Requesting: Xanax 0.5 MG ?Contract: 05/20/19 ?UDS:09/20/20 ?Last Visit:06/09/21 ?Next Visit: Not scheduled ?Last Refill: 03/28/21, #30, 0 refills ? ?Please Advise  ?

## 2021-09-08 ENCOUNTER — Ambulatory Visit: Payer: No Typology Code available for payment source | Admitting: Family

## 2021-09-13 NOTE — Telephone Encounter (Signed)
error 

## 2021-09-19 ENCOUNTER — Ambulatory Visit: Payer: No Typology Code available for payment source | Admitting: Family

## 2021-09-19 VITALS — BP 120/75 | HR 77 | Temp 98.4°F | Resp 16 | Ht 65.0 in | Wt 182.0 lb

## 2021-09-19 DIAGNOSIS — F419 Anxiety disorder, unspecified: Secondary | ICD-10-CM | POA: Diagnosis not present

## 2021-09-19 DIAGNOSIS — F32A Depression, unspecified: Secondary | ICD-10-CM | POA: Diagnosis not present

## 2021-09-19 DIAGNOSIS — M79601 Pain in right arm: Secondary | ICD-10-CM

## 2021-09-19 MED ORDER — ESCITALOPRAM OXALATE 20 MG PO TABS: 20.0000 mg | ORAL_TABLET | Freq: Every day | ORAL | 1 refills | Status: DC

## 2021-09-19 MED ORDER — IBUPROFEN 600 MG PO TABS: 600.0000 mg | ORAL_TABLET | Freq: Three times a day (TID) | ORAL | 1 refills | Status: AC | PRN

## 2021-09-19 NOTE — Assessment & Plan Note (Signed)
New.  Recommended the following for both arm pain and chronic low back pain.  ? ?Start tylenol 1000mg  twice daily. ?You may use ibuprofen 600 mg sparingly. ?

## 2021-09-19 NOTE — Progress Notes (Signed)
? ?Subjective:  ? ?By signing my name below, I, Carylon Perches, attest that this documentation has been prepared under the direction and in the presence of Debbrah Alar NP, 09/19/2021   ? ? Patient ID: Virginia Ayala, female    DOB: 1971/07/02, 51 y.o.   MRN: [id number] ? ?Chief Complaint  ?Patient presents with  ? Depression  ?  Here for follow up  ? Anxiety  ?  Here for follow up  ? Arm Pain  ?  Complains of pain on right arm  ? ? ?HPI ?Patient is in today for an office visit.  ? ?Right Arm Pain - Patient complains of right forearm pain that occasionally radiates to her right elbow. She states that her arm pain begun a couple of months ago. ? ?Refill - She is requesting 800 MG of Motrin for her back pain. She is also requesting a refill of 20 MG of Lexapro.   ? ?Anxiety - Her anxiety has been increasing due to her living situations. She has a roommate who is causing her stress. She is currently using 0.5 MG of Xanax daily.  ? ?Depression - She is currently taking 150 MG of Wellbutrin XL and 20 MG of Lexapro.  ? ?Health Maintenance Due  ?Topic Date Due  ? COLONOSCOPY (Pts 45-58yrs Insurance coverage will need to be confirmed)  Never done  ? COVID-19 Vaccine (3 - Booster for Moderna series) 02/19/2020  ? ? ?Past Medical History:  ?Diagnosis Date  ? Anxiety   ? Back pain   ? due to bulging disc lumbar spine  ? Depression   ? ? ?Past Surgical History:  ?Procedure Laterality Date  ? MOUTH SURGERY    ? in her 2's  ? ORTHOPEDIC SURGERY Left 1980  ? ? ?Family History  ?Problem Relation Age of Onset  ? Diabetes Father   ? Pulmonary embolism Father   ? Cancer Maternal Grandmother   ? Heart disease Maternal Grandmother   ? Heart disease Paternal Grandfather   ? Crohn's disease Daughter   ? Pulmonary embolism Paternal Aunt   ? ? ?Social History  ? ?Socioeconomic History  ? Marital status: Divorced  ?  Spouse name: Not on file  ? Number of children: Not on file  ? Years of education: Not on file  ? Highest education  level: Not on file  ?Occupational History  ? Not on file  ?Tobacco Use  ? Smoking status: Never  ? Smokeless tobacco: Never  ?Vaping Use  ? Vaping Use: Never used  ?Substance and Sexual Activity  ? Alcohol use: Not Currently  ? Drug use: Never  ? Sexual activity: Not Currently  ?  Birth control/protection: None  ?Other Topics Concern  ? Not on file  ?Social History Narrative  ? 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 2 daughters  ? Divorced  ? 2 dogs ([employer])   ? ?Social Determinants of Health  ? ?Financial Resource Strain: Not on file  ?Food Insecurity: Not on file  ?Transportation Needs: Not on file  ?Physical Activity: Not on file  ?Stress: Not on file  ?Social Connections: Not on file  ?Intimate Partner Violence: Not on file  ? ? ?Outpatient Medications Prior to Visit  ?Medication Sig Dispense Refill  ? ALPRAZolam (XANAX) 0.5 MG tablet TAKE 1 TABLET(0.5 MG) BY MOUTH AT BEDTIME AS NEEDED FOR ANXIETY 30 tablet 0  ? buPROPion (WELLBUTRIN XL) 150 MG 24 hr tablet TAKE 1 TABLET DAILY 90 tablet  1  ? cyclobenzaprine (FLEXERIL) 10 MG tablet Take 1 tablet (10 mg total) by mouth 2 (two) times daily as needed for muscle spasms. 20 tablet 0  ? escitalopram (LEXAPRO) 20 MG tablet TAKE 1 TABLET DAILY 90 tablet 0  ? methylPREDNISolone (MEDROL DOSEPAK) 4 MG TBPK tablet Take 6 tablets by mouth on day 1, then take 5 tablets on day 2, then 4 tablets on day 3, then 3 tablets on day 4, then 2 tablets on day 5, then 1 tablet on day 6. 21 each 0  ? nystatin (NYSTATIN) powder Apply 1 application topically 2 (two) times daily. 60 g 0  ? ondansetron (ZOFRAN-ODT) 4 MG disintegrating tablet Take 1 tablet (4 mg total) by mouth every 8 (eight) hours as needed for nausea. 20 tablet 0  ? ?No facility-administered medications prior to visit.  ? ? ?Allergies  ?Allergen Reactions  ? Sulfa Antibiotics Shortness Of Breath  ? Lodine [Etodolac] Itching  ? Penicillins Rash  ? ? ?Review of Systems  ?Musculoskeletal:  Positive for  back pain.  ?     (+) Right Arm Pain that occasionally radiates to right elbow  ?Psychiatric/Behavioral:  The patient is nervous/anxious.   ? ?   ?Objective:  ?  ?Physical Exam ?Constitutional:   ?   General: She is not in acute distress. ?   Appearance: Normal appearance. She is not ill-appearing.  ?HENT:  ?   Head: Normocephalic and atraumatic.  ?   Right Ear: External ear normal.  ?   Left Ear: External ear normal.  ?Eyes:  ?   Extraocular Movements: Extraocular movements intact.  ?   Pupils: Pupils are equal, round, and reactive to light.  ?Cardiovascular:  ?   Rate and Rhythm: Normal rate and regular rhythm.  ?   Heart sounds: Normal heart sounds. No murmur heard. ?  No gallop.  ?Pulmonary:  ?   Effort: Pulmonary effort is normal. No respiratory distress.  ?   Breath sounds: Normal breath sounds. No wheezing or rales.  ?Skin: ?   General: Skin is warm and dry.  ?Neurological:  ?   Mental Status: She is alert and oriented to person, place, and time.  ?Psychiatric:     ?   Mood and Affect: Mood normal.     ?   Behavior: Behavior normal.     ?   Judgment: Judgment normal.  ? ? ?BP (!) 124/91 (BP Location: Right Arm, Patient Position: Sitting, Cuff Size: Small)   Pulse 77   Temp 98.4 ?F (36.9 ?C) (Oral)   Resp 16   Ht 5\' 5"  (1.651 m)   Wt 182 lb (82.6 kg)   LMP 08/31/2018   SpO2 97%   BMI 30.29 kg/m?  ?Wt Readings from Last 3 Encounters:  ?09/19/21 182 lb (82.6 kg)  ?07/12/21 180 lb (81.6 kg)  ?06/09/21 184 lb 6.4 oz (83.6 kg)  ? ? ?   ?Assessment & Plan:  ? ?Problem List Items Addressed This Visit   ? ?  ? Unprioritized  ? Musculoskeletal arm pain, right  ?  New.  Recommended the following for both arm pain and chronic low back pain.  ? ?Start tylenol 1000mg  twice daily. ?You may use ibuprofen 600 mg sparingly. ?  ?  ? Depression  ?  Notes some increased anxiety related to her roommate. Continue current meds. Controlled substance contract is updated today. Will obtain UDS.  ?  ?  ? Relevant Medications  ?  escitalopram (LEXAPRO)  20 MG tablet  ? ?Other Visit Diagnoses   ? ? Anxiety    -  Primary  ? Relevant Medications  ? escitalopram (LEXAPRO) 20 MG tablet  ? Other Relevant Orders  ? DRUG MONITORING, PANEL 8 WITH CONFIRMATION, URINE  ? ?  ? ? ? ? ?Meds ordered this encounter  ?Medications  ? ibuprofen (ADVIL) 600 MG tablet  ?  Sig: Take 1 tablet (600 mg total) by mouth every 8 (eight) hours as needed for moderate pain (use as directed).  ?  Dispense:  30 tablet  ?  Refill:  1  ?  Order Specific Question:   Supervising Provider  ?  Answer:   Penni Homans A [id number]  ? escitalopram (LEXAPRO) 20 MG tablet  ?  Sig: Take 1 tablet (20 mg total) by mouth daily.  ?  Dispense:  90 tablet  ?  Refill:  1  ?  Order Specific Question:   Supervising Provider  ?  Answer:   Penni Homans A [id number]  ? ? ?I, Nance Pear, NP, personally preformed the services described in this documentation.  All medical record entries made by the scribe were at my direction and in my presence.  I have reviewed the chart and discharge instructions (if applicable) and agree that the record reflects my personal performance and is accurate and complete. 09/19/2021 ? ? ?I,Amber Collins,acting as a Education administrator for [employer], NP.,have documented all relevant documentation on the behalf of Nance Pear, NP,as directed by  Nance Pear, NP while in the presence of Nance Pear, NP. ? ? ? ?Nance Pear, NP ? ?

## 2021-09-19 NOTE — Patient Instructions (Signed)
Start tylenol 1000mg  twice daily. ?You may use ibuprofen sparingly. ? ?

## 2021-09-19 NOTE — Assessment & Plan Note (Signed)
Notes some increased anxiety related to her roommate. Continue current meds. Controlled substance contract is updated today. Will obtain UDS.  ?

## 2021-09-20 LAB — DM TEMPLATE

## 2021-09-20 LAB — DRUG MONITORING, PANEL 8 WITH CONFIRMATION, URINE
6 Acetylmorphine: NEGATIVE ng/mL (ref <10)
Alcohol Metabolites: NEGATIVE ng/mL (ref <500)
Amphetamines: NEGATIVE ng/mL (ref <500)
Benzodiazepines: NEGATIVE ng/mL (ref <100)
Buprenorphine, Urine: NEGATIVE ng/mL (ref <5)
Cocaine Metabolite: NEGATIVE ng/mL (ref <150)
Creatinine: 22.8 mg/dL (ref >=20.0)
MDMA: NEGATIVE ng/mL (ref <500)
Marijuana Metabolite: NEGATIVE ng/mL (ref <20)
Opiates: NEGATIVE ng/mL (ref <100)
Oxidant: NEGATIVE ug/mL (ref <200)
Oxycodone: NEGATIVE ng/mL (ref <100)
pH: 6.1 (ref 4.5–9.0)

## 2021-10-24 ENCOUNTER — Other Ambulatory Visit: Payer: Self-pay | Admitting: Family

## 2021-11-04 IMAGING — MG MM DIGITAL SCREENING BILAT W/ TOMO AND CAD
8 series · 8 of 24 positions shown · non-contrast
Comparison: Previous exam(s).

CLINICAL DATA: Screening.

EXAM:
DIGITAL SCREENING BILATERAL MAMMOGRAM WITH TOMOSYNTHESIS AND CAD
TECHNIQUE: Bilateral screening digital craniocaudal and mediolateral oblique
mammograms were obtained. Bilateral screening digital breast
tomosynthesis was performed. The images were evaluated with
computer-aided detection.

[L MLO synth-2D]
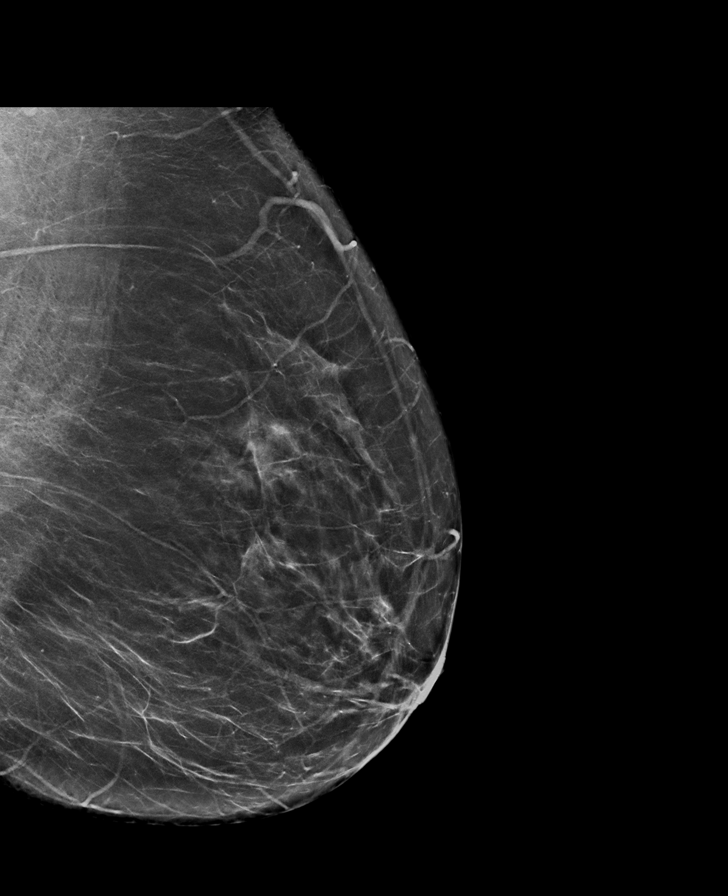

[R CC synth-2D]
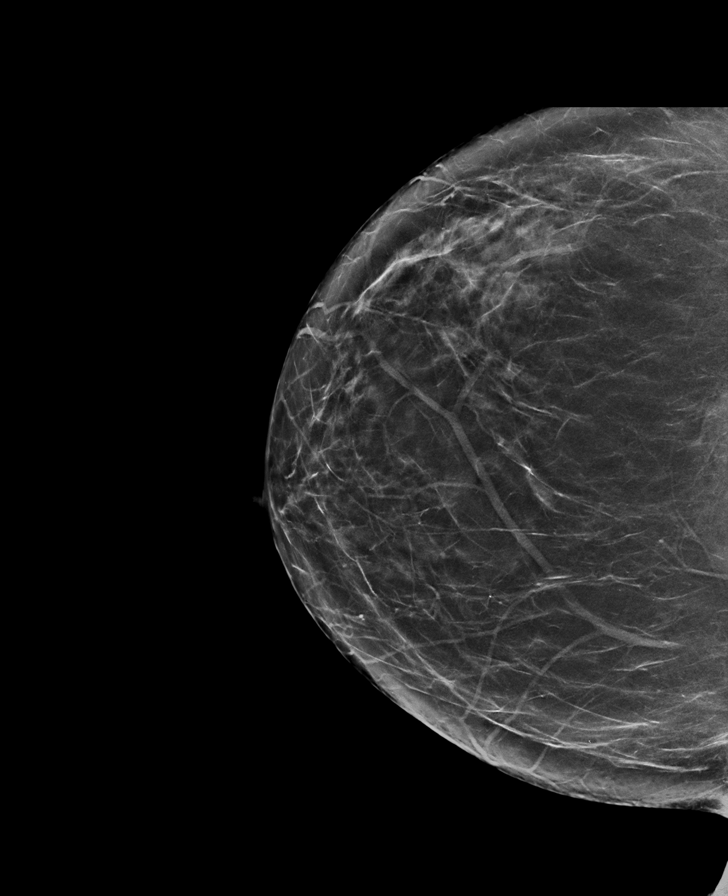

[L CC synth-2D]
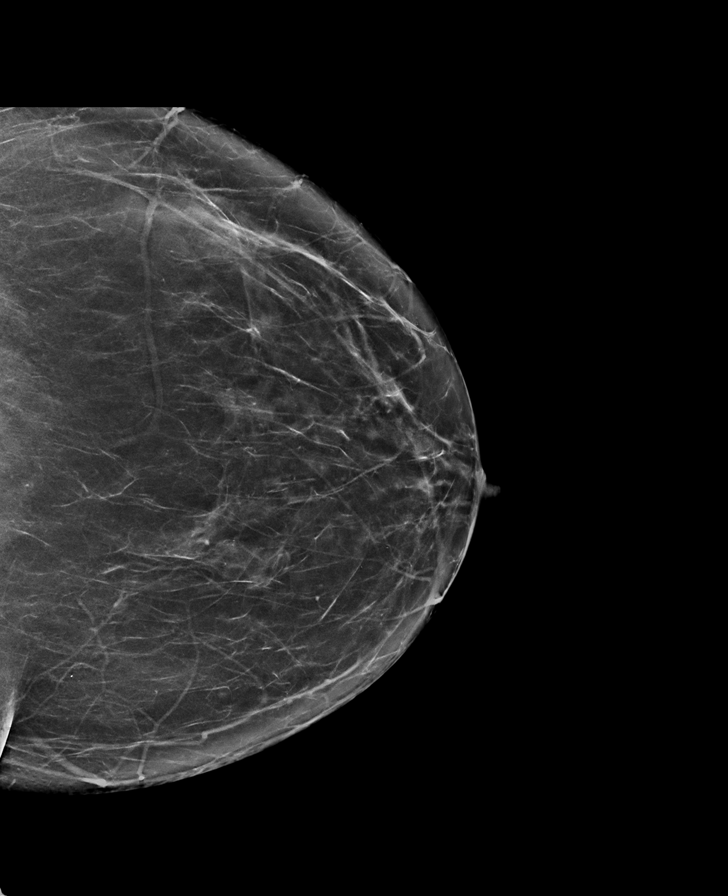

[R MLO synth-2D]
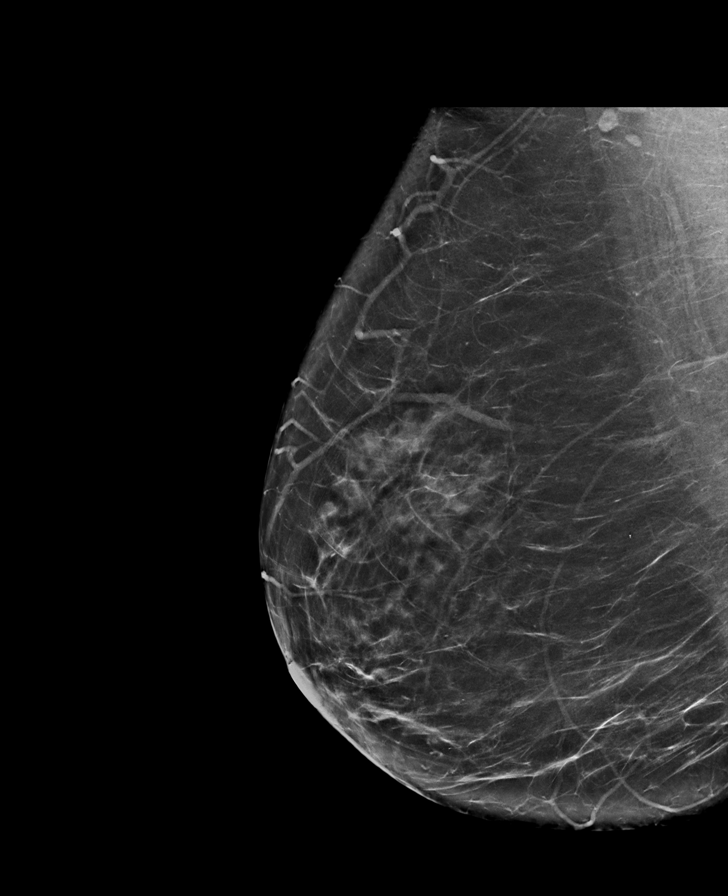

[R CC tomo · tomo slice 35/69.0]
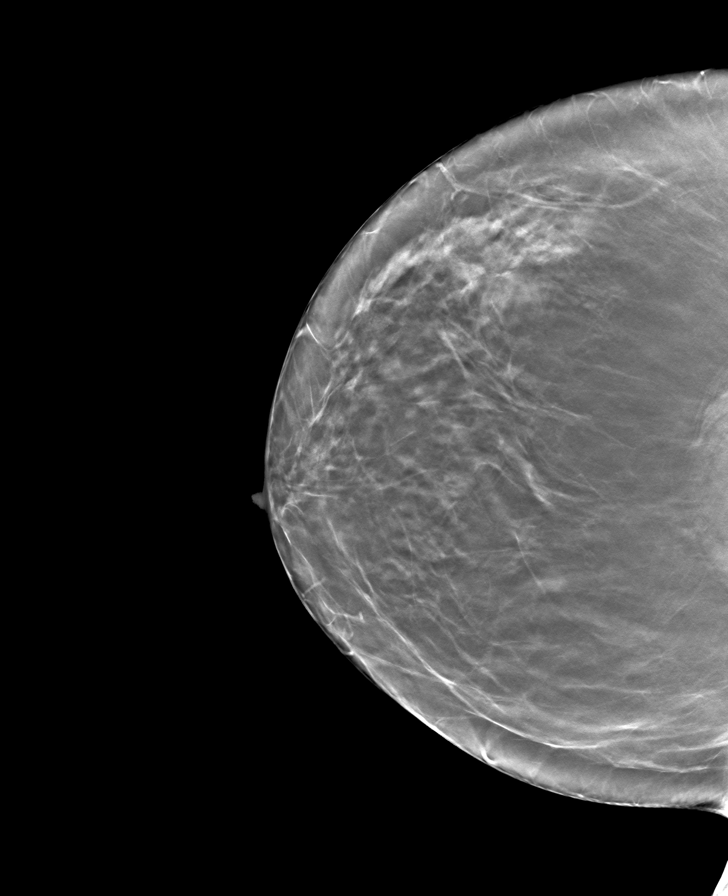

[R MLO tomo · tomo slice 39/78.0]
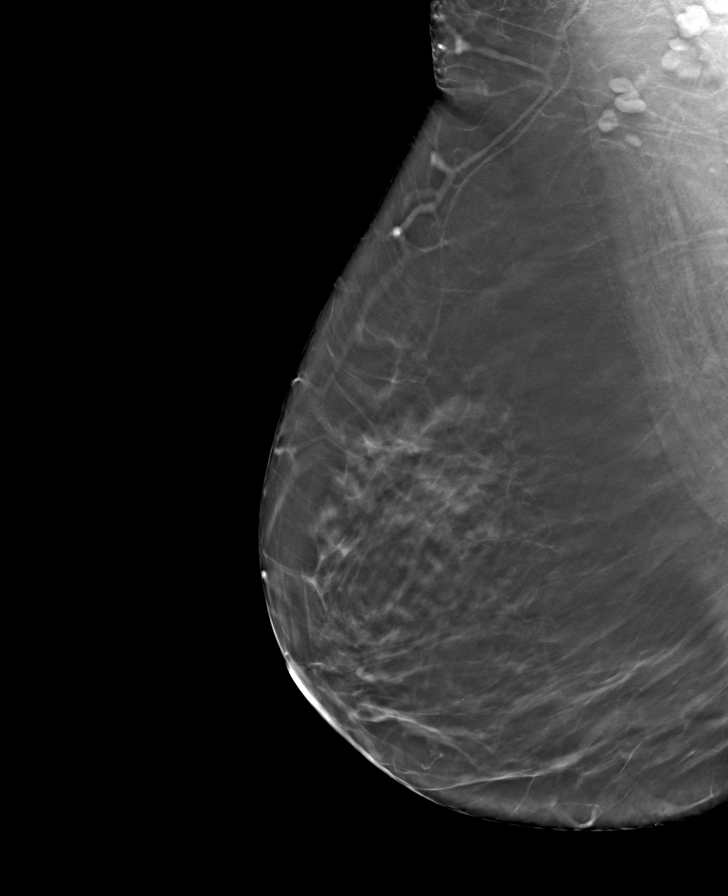

[L CC tomo · tomo slice 35/68.0]
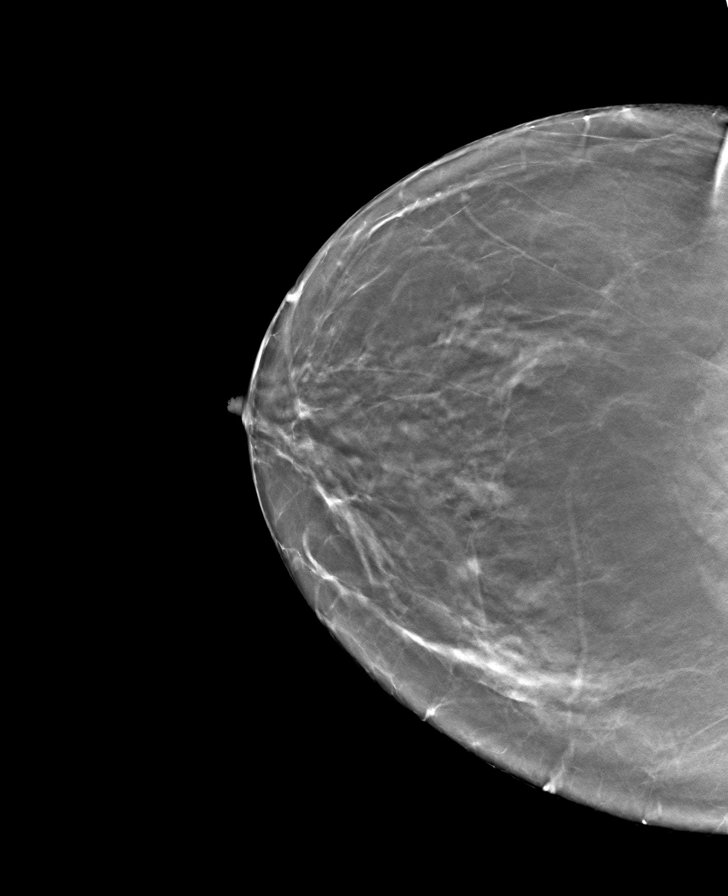

[L MLO tomo · tomo slice 38/75.0]
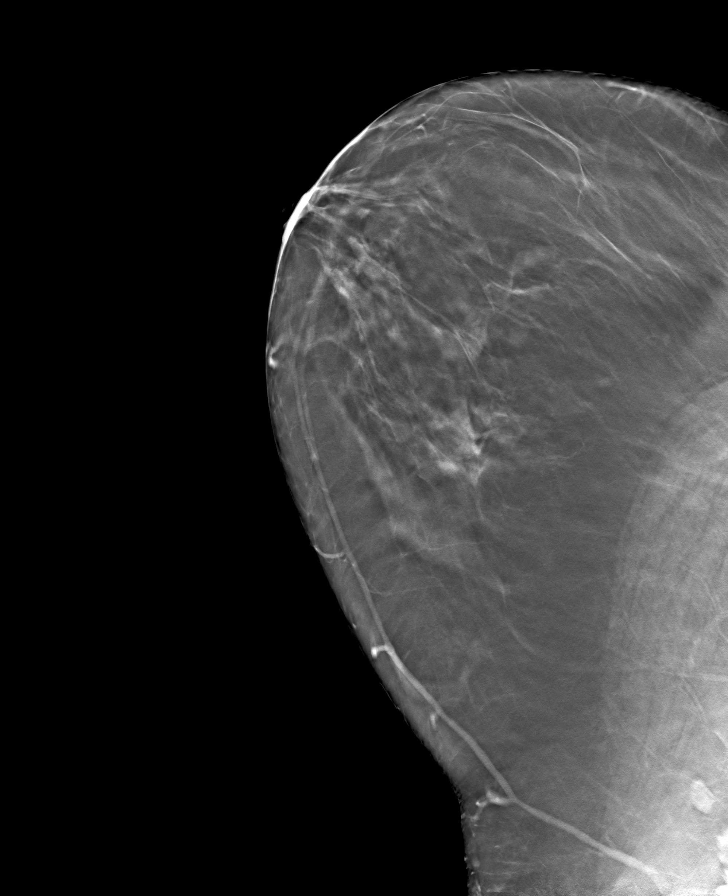

[8 of 24 positions shown; findings below may reference images not displayed]

ACR Breast Density Category c: The breast tissue is heterogeneously
dense, which may obscure small masses.
FINDINGS: There are no findings suspicious for malignancy. The images were
evaluated with computer-aided detection.
IMPRESSION: No mammographic evidence of malignancy. A result letter of this
screening mammogram will be mailed directly to the patient.

RECOMMENDATION:
Screening mammogram in one year. (Code:T4-5-GWO)

BI-RADS CATEGORY  1: Negative.

## 2021-11-10 ENCOUNTER — Telehealth: Payer: Self-pay | Admitting: Family

## 2021-11-10 NOTE — Telephone Encounter (Signed)
See mychart.  

## 2021-11-22 ENCOUNTER — Other Ambulatory Visit: Payer: Self-pay | Admitting: Family

## 2021-11-22 NOTE — Telephone Encounter (Signed)
Requesting:xanax 0.5 mg Contract:09/19/21 UDS:09/19/21 Last Visit:09/19/21 Next Visit:unknown Last Refill:09/01/21  Please Advise

## 2022-01-29 ENCOUNTER — Other Ambulatory Visit: Payer: Self-pay | Admitting: Family

## 2022-02-22 ENCOUNTER — Other Ambulatory Visit: Payer: Self-pay | Admitting: Family

## 2022-02-22 NOTE — Telephone Encounter (Signed)
Last RX: 11-22-21  Last OV:09-19-21 Next OV: none scheduled UDS:09-19-21 CSC:09-19-21

## 2022-02-22 NOTE — Telephone Encounter (Signed)
Please schedule pt a follow up visit.

## 2022-04-27 ENCOUNTER — Other Ambulatory Visit: Payer: Self-pay | Admitting: Family

## 2022-05-14 ENCOUNTER — Other Ambulatory Visit: Payer: Self-pay | Admitting: Family

## 2022-05-15 NOTE — Telephone Encounter (Signed)
Last RX: 02/22/2022 Last OV: 12/01/2021 Next OV: no future appointments UDS:  09/19/2021 CSC:  09/19/2021

## 2022-05-25 ENCOUNTER — Other Ambulatory Visit: Payer: Self-pay | Admitting: Family

## 2022-06-21 ENCOUNTER — Other Ambulatory Visit: Payer: Self-pay | Admitting: Family

## 2022-07-26 ENCOUNTER — Encounter: Payer: Self-pay | Admitting: Family

## 2022-07-26 ENCOUNTER — Other Ambulatory Visit: Payer: Self-pay | Admitting: Family

## 2022-07-31 ENCOUNTER — Ambulatory Visit (INDEPENDENT_AMBULATORY_CARE_PROVIDER_SITE_OTHER): Payer: No Typology Code available for payment source | Admitting: Family

## 2022-07-31 VITALS — BP 111/79 | HR 81 | Temp 98.2°F | Resp 16 | Wt 183.0 lb

## 2022-07-31 DIAGNOSIS — F419 Anxiety disorder, unspecified: Secondary | ICD-10-CM | POA: Diagnosis not present

## 2022-07-31 DIAGNOSIS — R400 Somnolence: Secondary | ICD-10-CM | POA: Diagnosis not present

## 2022-07-31 DIAGNOSIS — Z1211 Encounter for screening for malignant neoplasm of colon: Secondary | ICD-10-CM | POA: Diagnosis not present

## 2022-07-31 DIAGNOSIS — Z1231 Encounter for screening mammogram for malignant neoplasm of breast: Secondary | ICD-10-CM | POA: Diagnosis not present

## 2022-07-31 DIAGNOSIS — F32A Depression, unspecified: Secondary | ICD-10-CM

## 2022-07-31 MED ORDER — BUPROPION HCL ER (XL) 150 MG PO TB24: 150.0000 mg | ORAL_TABLET | Freq: Every day | ORAL | 1 refills | Status: DC

## 2022-07-31 MED ORDER — ESCITALOPRAM OXALATE 20 MG PO TABS: 20.0000 mg | ORAL_TABLET | Freq: Every day | ORAL | 1 refills | Status: DC

## 2022-07-31 MED ORDER — ALPRAZOLAM 0.5 MG PO TABS: ORAL_TABLET | ORAL | 0 refills | Status: DC

## 2022-07-31 NOTE — Progress Notes (Signed)
Subjective:     Patient ID: Virginia Ayala, female    DOB: 08-24-1970, 52 y.o.   MRN: [id number]  Chief Complaint  Patient presents with   Anxiety    Here for follow up   Depression    Here for follow up    HPI Patient is in today for follow up.  Maintained on lexapro ans wellbutrin. Uses xanax less since roommate moved out.  This has dramatically helped her mood as well.   Notes that she feels tired during the day.  Lays down at 11 pm.  Notes that she is up and down a lot due to her dogs.  She reports that she wakes up at midnight and then 2 and then usually wakes up by 7.  She tried tylenol pm but then had trouble.  She does like to nap a lot because she is tired.  She reports that she does snore but it has never been addressed as a problem. Feels tired "all the time."     Health Maintenance Due  Topic Date Due   COLONOSCOPY (Pts 45-7yrs Insurance coverage will need to be confirmed)  Never done   COVID-19 Vaccine (3 - 2023-24 season) 03/02/2022    Past Medical History:  Diagnosis Date   Anxiety    Back pain    due to bulging disc lumbar spine   Depression     Past Surgical History:  Procedure Laterality Date   MOUTH SURGERY     in her 41's   ORTHOPEDIC SURGERY Left 1980    Family History  Problem Relation Age of Onset   Diabetes Father    Pulmonary embolism Father    Cancer Maternal Grandmother    Heart disease Maternal Grandmother    Heart disease Paternal Grandfather    Crohn's disease Daughter    Pulmonary embolism Paternal Aunt     Social History   Socioeconomic History   Marital status: Divorced    Spouse name: Not on file   Number of children: Not on file   Years of education: Not on file   Highest education level: Not on file  Occupational History   Not on file  Tobacco Use   Smoking status: Never   Smokeless tobacco: Never  Vaping Use   Vaping Use: Never used  Substance and Sexual Activity   Alcohol use: Not Currently   Drug use: Never    Sexual activity: Not Currently    Birth control/protection: None  Other Topics Concern   Not on file  Social History Narrative   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   Divorced   2 dogs ([employer])    Social Determinants of Health   Financial Resource Strain: Not on file  Food Insecurity: Not on file  Transportation Needs: Not on file  Physical Activity: Unknown (05/20/2019)   Exercise Vital Sign    Days of Exercise per Week: 0 days    Minutes of Exercise per Session: Not on file  Stress: Not on file  Social Connections: Socially Isolated (05/20/2019)   Social Connection and Isolation Panel [NHANES]    Frequency of Communication with Friends and Family: More than three times a week    Frequency of Social Gatherings with Friends and Family: Once a week    Attends Religious Services: Never    Marine scientist or Organizations: No    Attends Archivist Meetings: Never    Marital Status: Separated  Intimate Partner Violence: Not At Risk (05/20/2019)   Humiliation, Afraid, Rape, and Kick questionnaire    Fear of Current or Ex-Partner: No    Emotionally Abused: No    Physically Abused: No    Sexually Abused: No    Outpatient Medications Prior to Visit  Medication Sig Dispense Refill   ibuprofen (ADVIL) 600 MG tablet Take 1 tablet (600 mg total) by mouth every 8 (eight) hours as needed for moderate pain (use as directed). 30 tablet 1   ALPRAZolam (XANAX) 0.5 MG tablet TAKE 1 TABLET(0.5 MG) BY MOUTH AT BEDTIME AS NEEDED FOR ANXIETY 30 tablet 0   buPROPion (WELLBUTRIN XL) 150 MG 24 hr tablet TAKE 1 TABLET DAILY 90 tablet 0   escitalopram (LEXAPRO) 20 MG tablet TAKE 1 TABLET DAILY 90 tablet 0   No facility-administered medications prior to visit.    Allergies  Allergen Reactions   Sulfa Antibiotics Shortness Of Breath   Lodine [Etodolac] Itching   Penicillins Rash    ROS See HPI    Objective:    Physical  Exam Constitutional:      General: She is not in acute distress.    Appearance: Normal appearance. She is well-developed.  HENT:     Head: Normocephalic and atraumatic.     Right Ear: External ear normal.     Left Ear: External ear normal.  Eyes:     General: No scleral icterus. Neck:     Thyroid: No thyromegaly.  Cardiovascular:     Rate and Rhythm: Normal rate and regular rhythm.     Heart sounds: Normal heart sounds. No murmur heard. Pulmonary:     Effort: Pulmonary effort is normal. No respiratory distress.     Breath sounds: Normal breath sounds. No wheezing.  Musculoskeletal:     Cervical back: Neck supple.  Skin:    General: Skin is warm and dry.  Neurological:     Mental Status: She is alert and oriented to person, place, and time.  Psychiatric:        Mood and Affect: Mood normal.        Behavior: Behavior normal.        Thought Content: Thought content normal.        Judgment: Judgment normal.     BP 111/79 (BP Location: Right Arm, Patient Position: Sitting, Cuff Size: Small)   Pulse 81   Temp 98.2 F (36.8 C) (Oral)   Resp 16   Wt 183 lb (83 kg)   LMP 08/31/2018   SpO2 98%   BMI 30.45 kg/m  Wt Readings from Last 3 Encounters:  07/31/22 183 lb (83 kg)  09/19/21 182 lb (82.6 kg)  07/12/21 180 lb (81.6 kg)       Assessment & Plan:   Problem List Items Addressed This Visit       Unprioritized   Depression    Improved given improvement in her living situation.  Continue current doses of lexapro and wellbutirn.        Relevant Medications   escitalopram (LEXAPRO) 20 MG tablet   buPROPion (WELLBUTRIN XL) 150 MG 24 hr tablet   ALPRAZolam (XANAX) 0.5 MG tablet   Daytime somnolence - Primary    Will refer to sleep specialist for sleep study.       Relevant Orders   Ambulatory referral to Pulmonology   Anxiety    Continues xanax on a prn basis.  UDS and controlled substance contract are updated.  Relevant Medications   escitalopram  (LEXAPRO) 20 MG tablet   buPROPion (WELLBUTRIN XL) 150 MG 24 hr tablet   ALPRAZolam (XANAX) 0.5 MG tablet   Other Relevant Orders   DRUG MONITORING, PANEL 8 WITH CONFIRMATION, URINE (Completed)   Other Visit Diagnoses     Breast cancer screening by mammogram       Relevant Orders   MM 3D SCREEN BREAST BILATERAL   Screening for colon cancer       Relevant Orders   Cologuard       I have changed Levetta Mooney's escitalopram and buPROPion. I am also having her maintain her ibuprofen and ALPRAZolam.  Meds ordered this encounter  Medications   escitalopram (LEXAPRO) 20 MG tablet    Sig: Take 1 tablet (20 mg total) by mouth daily.    Dispense:  90 tablet    Refill:  1    Order Specific Question:   Supervising Provider    Answer:   Penni Homans A [[id number]]   buPROPion (WELLBUTRIN XL) 150 MG 24 hr tablet    Sig: Take 1 tablet (150 mg total) by mouth daily.    Dispense:  90 tablet    Refill:  1    Order Specific Question:   Supervising Provider    Answer:   Penni Homans A [[id number]]   ALPRAZolam (XANAX) 0.5 MG tablet    Sig: TAKE 1 TABLET(0.5 MG) BY MOUTH AT BEDTIME AS NEEDED FOR ANXIETY    Dispense:  30 tablet    Refill:  0    Order Specific Question:   Supervising Provider    Answer:   Penni Homans A [[id number]]

## 2022-08-01 LAB — DRUG MONITORING, PANEL 8 WITH CONFIRMATION, URINE
6 Acetylmorphine: NEGATIVE ng/mL (ref <10)
Alcohol Metabolites: NEGATIVE ng/mL (ref <500)
Amphetamines: NEGATIVE ng/mL (ref <500)
Benzodiazepines: NEGATIVE ng/mL (ref <100)
Buprenorphine, Urine: NEGATIVE ng/mL (ref <5)
Cocaine Metabolite: NEGATIVE ng/mL (ref <150)
Creatinine: 55.1 mg/dL (ref >=20.0)
MDMA: NEGATIVE ng/mL (ref <500)
Marijuana Metabolite: NEGATIVE ng/mL (ref <20)
Opiates: NEGATIVE ng/mL (ref <100)
Oxidant: NEGATIVE ug/mL (ref <200)
Oxycodone: NEGATIVE ng/mL (ref <100)
pH: 7.2 (ref 4.5–9.0)

## 2022-08-01 LAB — DM TEMPLATE

## 2022-08-02 DIAGNOSIS — R400 Somnolence: Secondary | ICD-10-CM | POA: Insufficient documentation

## 2022-08-02 DIAGNOSIS — F419 Anxiety disorder, unspecified: Secondary | ICD-10-CM | POA: Insufficient documentation

## 2022-08-02 NOTE — Assessment & Plan Note (Signed)
Will refer to sleep specialist for sleep study.

## 2022-08-02 NOTE — Assessment & Plan Note (Signed)
Continues xanax on a prn basis.  UDS and controlled substance contract are updated.

## 2022-08-02 NOTE — Assessment & Plan Note (Signed)
Improved given improvement in her living situation.  Continue current doses of lexapro and wellbutirn.

## 2022-08-14 ENCOUNTER — Telehealth (HOSPITAL_BASED_OUTPATIENT_CLINIC_OR_DEPARTMENT_OTHER): Payer: Self-pay

## 2022-08-27 ENCOUNTER — Ambulatory Visit (HOSPITAL_BASED_OUTPATIENT_CLINIC_OR_DEPARTMENT_OTHER)
Admission: RE | Admit: 2022-08-27 | Discharge: 2022-08-27 | Disposition: A | Discharge status: Home / Self Care | Payer: No Typology Code available for payment source | Source: Ambulatory Visit | Attending: Family | Admitting: Family

## 2022-08-27 ENCOUNTER — Encounter (HOSPITAL_BASED_OUTPATIENT_CLINIC_OR_DEPARTMENT_OTHER): Payer: Self-pay

## 2022-08-27 DIAGNOSIS — Z1231 Encounter for screening mammogram for malignant neoplasm of breast: Secondary | ICD-10-CM | POA: Diagnosis present

## 2022-09-08 LAB — COLOGUARD: COLOGUARD: NEGATIVE

## 2022-10-22 ENCOUNTER — Other Ambulatory Visit: Payer: Self-pay | Admitting: Family

## 2022-10-22 NOTE — Telephone Encounter (Signed)
Requesting: alprazolam 0.5mg   Contract: 09/19/21  UDS: 07/31/22 Last Visit: 07/31/22 Next Visit: 01/29/23 Last Refill: 07/31/22 #30 and 0RF   Please Advise

## 2022-10-23 ENCOUNTER — Other Ambulatory Visit: Payer: Self-pay

## 2022-10-23 ENCOUNTER — Other Ambulatory Visit (HOSPITAL_BASED_OUTPATIENT_CLINIC_OR_DEPARTMENT_OTHER): Payer: Self-pay

## 2022-10-23 MED ORDER — ALPRAZOLAM 0.5 MG PO TABS
0.5000 mg | ORAL_TABLET | Freq: Every evening | ORAL | 0 refills | Status: DC | PRN
  Filled 2022-10-23: qty 30, 30d supply, fill #0

## 2022-11-29 ENCOUNTER — Encounter: Payer: Self-pay | Admitting: Family

## 2022-11-29 MED ORDER — ESCITALOPRAM OXALATE 20 MG PO TABS: 20.0000 mg | ORAL_TABLET | Freq: Every day | ORAL | 0 refills | Status: DC

## 2022-11-29 MED ORDER — BUPROPION HCL ER (XL) 150 MG PO TB24: 150.0000 mg | ORAL_TABLET | Freq: Every day | ORAL | 0 refills | Status: DC

## 2023-01-29 ENCOUNTER — Ambulatory Visit (INDEPENDENT_AMBULATORY_CARE_PROVIDER_SITE_OTHER): Payer: No Typology Code available for payment source | Admitting: Family

## 2023-01-29 ENCOUNTER — Encounter: Payer: Self-pay | Admitting: Family

## 2023-01-29 VITALS — BP 118/79 | HR 74 | Temp 98.1°F | Resp 16 | Ht 65.0 in | Wt 168.0 lb

## 2023-01-29 DIAGNOSIS — Z0000 Encounter for general adult medical examination without abnormal findings: Secondary | ICD-10-CM

## 2023-01-29 DIAGNOSIS — D582 Other hemoglobinopathies: Secondary | ICD-10-CM

## 2023-01-29 DIAGNOSIS — E871 Hypo-osmolality and hyponatremia: Secondary | ICD-10-CM

## 2023-01-29 DIAGNOSIS — E785 Hyperlipidemia, unspecified: Secondary | ICD-10-CM

## 2023-01-29 DIAGNOSIS — F419 Anxiety disorder, unspecified: Secondary | ICD-10-CM

## 2023-01-29 MED ORDER — ESCITALOPRAM OXALATE 20 MG PO TABS: 10.0000 mg | ORAL_TABLET | Freq: Every day | ORAL | Status: DC

## 2023-01-29 NOTE — Assessment & Plan Note (Signed)
Mood is stable.  She is concerned that the lexapro may be causing her to feel more tired. Will decrease lexapro from 20mg  to 10mg .

## 2023-01-29 NOTE — Progress Notes (Signed)
Subjective:     Patient ID: Virginia Ayala, female    DOB: [birth year], 52 y.o.   MRN: [id number]  No chief complaint on file.   HPI  Discussed the use of AI scribe software for clinical note transcription with the patient, who gave verbal consent to proceed.  History of Present Illness          Patient presents today for complete physical.  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   Depression     Past Surgical History:  Procedure Laterality Date   MOUTH SURGERY     in her 70's   ORTHOPEDIC SURGERY Left 1980    Family History  Problem Relation Age of Onset   Diabetes Father    Pulmonary embolism Father    Cancer Maternal Grandmother    Heart disease Maternal Grandmother    Heart disease Paternal Grandfather    Crohn's disease Daughter    Pulmonary embolism Paternal Aunt     Social History   Socioeconomic History   Marital status: Divorced    Spouse name: Not on file   Number of children: Not on file   Years of education: Not on file   Highest education level: Not on file  Occupational History   Not on file  Tobacco Use   Smoking status: Never   Smokeless tobacco: Never  Vaping Use   Vaping status: Never Used  Substance and Sexual Activity   Alcohol use: Not Currently   Drug use: Never   Sexual activity: Not Currently    Birth control/protection: None  Other Topics Concern   Not on file  Social History Narrative   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    Divorced   2 dogs (Runner, broadcasting/film/video)    Social Determinants of Health   Financial Resource Strain: Not on file  Food Insecurity: Not on file  Transportation Needs: Not on file  Physical Activity: Unknown (05/20/2019)   Exercise Vital Sign    Days of Exercise per Week: 0 days    Minutes of Exercise per Session: Not on file  Stress: Not on file  Social Connections: Socially Isolated (05/20/2019)   Social Connection and Isolation Panel [NHANES]    Frequency of Communication with Friends and Family: More than three times a week    Frequency of Social Gatherings with Friends and Family: Once a week    Attends Religious Services: Never    Database administrator or Organizations: No    Attends Banker Meetings: Never    Marital Status: Separated  Intimate Partner Violence: Not At Risk (05/20/2019)   Humiliation, Afraid, Rape, and Kick questionnaire    Fear of Current or Ex-Partner: No    Emotionally Abused: No    Physically Abused: No    Sexually Abused: No    Outpatient Medications Prior to Visit  Medication Sig Dispense Refill   ALPRAZolam Prudy Feeler)  0.5 MG tablet Take 1 tablet (0.5 mg total) by mouth at bedtime as needed. For anxiety 30 tablet 0   buPROPion (WELLBUTRIN XL) 150 MG 24 hr tablet Take 1 tablet (150 mg total) by mouth daily. 90 tablet 0   ibuprofen (ADVIL) 600 MG tablet Take 1 tablet (600 mg total) by mouth every 8 (eight) hours as needed for moderate pain (use as directed). 30 tablet 1   escitalopram (LEXAPRO) 20 MG tablet Take 1 tablet (20 mg total) by mouth daily. 90 tablet 0   No facility-administered medications prior to visit.    Allergies  Allergen Reactions   Sulfa Antibiotics Shortness Of Breath   Lodine [Etodolac] Itching   Penicillins Rash    Review of Systems  Constitutional:  Positive for weight loss.  HENT:  Negative for congestion and hearing loss.   Eyes:  Negative for blurred vision.  Respiratory:  Negative for cough.    Cardiovascular:  Negative for leg swelling.  Gastrointestinal:  Negative for constipation and diarrhea.  Genitourinary:  Negative for dysuria and frequency.  Musculoskeletal:  Positive for back pain. Negative for joint pain and myalgias.  Skin:  Negative for rash.  Neurological:  Negative for headaches.  Psychiatric/Behavioral:         Stable mood       Objective:    Physical Exam   BP 118/79 (BP Location: Right Arm, Patient Position: Sitting, Cuff Size: Small)   Pulse 74   Temp 98.1 F (36.7 C) (Oral)   Resp 16   Ht 5\' 5"  (1.651 m)   Wt 168 lb (76.2 kg)   LMP 08/31/2018   SpO2 98%   BMI 27.96 kg/m  Wt Readings from Last 3 Encounters:  01/29/23 168 lb (76.2 kg)  07/31/22 183 lb (83 kg)  09/19/21 182 lb (82.6 kg)   Physical Exam  Constitutional: She is oriented to person, place, and time. She appears well-developed and well-nourished. No distress.  HENT:  Head: Normocephalic and atraumatic.  Right Ear: Tympanic membrane and ear canal normal.  Left Ear: Tympanic membrane and ear canal normal.  Mouth/Throat: Oropharynx is clear and moist.  Eyes: Pupils are equal, round, and reactive to light. No scleral icterus.  Neck: Normal range of motion. No thyromegaly present.  Cardiovascular: Normal rate and regular rhythm.   No murmur heard. Pulmonary/Chest: Effort normal and breath sounds normal. No respiratory distress. He has no wheezes. She has no rales. She exhibits no tenderness.  Abdominal: Soft. Bowel sounds are normal. She exhibits no distension and no mass. There is no tenderness. There is no rebound and no guarding.  Musculoskeletal: She exhibits no edema.  Lymphadenopathy:    She has no cervical adenopathy.  Neurological: She is alert and oriented to person, place, and time. She has normal patellar reflexes. She exhibits normal muscle tone. Coordination normal.  Skin: Skin is warm and dry.  Psychiatric: She has a normal mood and affect. Her behavior is normal.  Judgment and thought content normal.  Breast/pelvic: deferred        Assessment & Plan:       Assessment & Plan:   Problem List Items Addressed This Visit       Unprioritized   Preventative health care    General Health Maintenance: Up to date on most preventive screenings and immunizations. -Consider getting a flu shot and COVID booster. -Repeat labs including kidney function, blood sugar, and cholesterol. - Recent weight loss on [employer]risystem diet. Encouraged to continue diet and  increase physical activity. -Continue Nutrisystem diet and aim for 30 minutes of walking five days a week. -Follow up in three months to assess fatigue and weight loss progress.      Anxiety    Mood is stable.  She is concerned that the lexapro may be causing her to feel more tired. Will decrease lexapro from 20mg  to 10mg .       Relevant Medications   escitalopram (LEXAPRO) 20 MG tablet   Other Visit Diagnoses     Hyponatremia    -  Primary   Relevant Orders   Comp Met (CMET)   Elevated hemoglobin (HCC)       Relevant Orders   CBC w/Diff   Hyperlipidemia, unspecified hyperlipidemia type       Relevant Orders   Lipid panel       I have changed Virginia Ayala's escitalopram. I am also having her maintain her ibuprofen, ALPRAZolam, and buPROPion.  Meds ordered this encounter  Medications   escitalopram (LEXAPRO) 20 MG tablet    Sig: Take 0.5 tablets (10 mg total) by mouth daily.    Order Specific Question:   Supervising Provider    Answer:   Danise Edge A [[id number]]

## 2023-01-29 NOTE — Assessment & Plan Note (Signed)
General Health Maintenance: Up to date on most preventive screenings and immunizations. -Consider getting a flu shot and COVID booster. -Repeat labs including kidney function, blood sugar, and cholesterol. - Recent weight loss on Nutrisystem diet. Encouraged to continue diet and increase physical activity. -Continue Nutrisystem diet and aim for 30 minutes of walking five days a week. -Follow up in three months to assess fatigue and weight loss progress.

## 2023-01-29 NOTE — Patient Instructions (Signed)
VISIT SUMMARY:  During your visit, we discussed your ongoing fatigue, weight management, and chronic back pain. Your mood has been stable on Lexapro, but you've been experiencing persistent fatigue. You've been successful with weight loss through the Nutrisystem diet, and we discussed the importance of incorporating more physical activity into your routine. Your back pain continues to be a significant issue, but you've noticed some improvement with weight loss. You're up to date with most preventive screenings and immunizations.  YOUR PLAN:  -CHRONIC FATIGUE: Your fatigue might be due to your current dosage of Lexapro. We will reduce the dosage to 10mg  daily and monitor for any changes in your mood.  -WEIGHT MANAGEMENT: You've been successful in losing weight through the [employer]. Continue with this diet and aim to incorporate 30 minutes of walking five days a week into your routine.  -GENERAL HEALTH MAINTENANCE: You're up to date with most preventive screenings and immunizations. Consider getting a flu shot and a COVID-19 booster. We will also repeat labs to check your kidney function, blood sugar, and cholesterol levels.  INSTRUCTIONS:  Please follow up in three months to assess your fatigue and weight loss progress. In the meantime, if you notice any changes in your mood after reducing your Lexapro dosage, please contact the office immediately.

## 2023-01-30 ENCOUNTER — Telehealth: Payer: Self-pay | Admitting: Family

## 2023-01-30 DIAGNOSIS — R7989 Other specified abnormal findings of blood chemistry: Secondary | ICD-10-CM

## 2023-01-30 NOTE — Telephone Encounter (Addendum)
One of her liver tests is elevated.  I would like to repeat her LFT's in 2 weeks. If still elevated at that time, then I would do an ultrasound of her liver/gallbladder.  Cholesterol is elevated but better than it was previous- continue the good work on diet/exercise.  Did she ever hear back from pulmonology about scheduling her appointment with the sleep specialist?  Please give her the number to call them.

## 2023-01-30 NOTE — Telephone Encounter (Signed)
Patient advised of abnormal results and scheduled to come back in 2 weeks for labs; also to continue to work on her diet  Patient reports she did received a call from pulmonology but she is not interested in seeing them at this time. She will call them back if she changes her mind.

## 2023-02-13 ENCOUNTER — Other Ambulatory Visit (INDEPENDENT_AMBULATORY_CARE_PROVIDER_SITE_OTHER): Payer: No Typology Code available for payment source

## 2023-02-13 DIAGNOSIS — R7989 Other specified abnormal findings of blood chemistry: Secondary | ICD-10-CM

## 2023-02-14 LAB — HEPATIC FUNCTION PANEL
ALT: 43 U/L — ABNORMAL HIGH (ref 0–35)
AST: 29 U/L (ref 0–37)
Albumin: 4.3 g/dL (ref 3.5–5.2)
Alkaline Phosphatase: 101 U/L (ref 39–117)
Bilirubin, Direct: 0.1 mg/dL (ref 0.0–0.3)
Total Bilirubin: 0.4 mg/dL (ref 0.2–1.2)
Total Protein: 6.9 g/dL (ref 6.0–8.3)

## 2023-02-15 ENCOUNTER — Other Ambulatory Visit: Payer: Self-pay | Admitting: Family

## 2023-03-06 ENCOUNTER — Other Ambulatory Visit: Payer: Self-pay | Admitting: Family

## 2023-03-06 ENCOUNTER — Encounter: Payer: Self-pay | Admitting: Family

## 2023-03-06 MED ORDER — ALPRAZOLAM 0.5 MG PO TABS
0.5000 mg | ORAL_TABLET | Freq: Every evening | ORAL | 0 refills | Status: DC | PRN
  Filled 2023-03-06: qty 30, 30d supply, fill #0

## 2023-03-06 NOTE — Telephone Encounter (Signed)
Requesting: alprazolam 0.5mg   Contract:08/16/22 UDS: 07/31/22 Last Visit: 01/29/23 Next Visit: 05/03/23 Last Refill: 10/23/22 #30 and 0RF  Please Advise

## 2023-03-07 ENCOUNTER — Other Ambulatory Visit: Payer: Self-pay

## 2023-03-07 ENCOUNTER — Other Ambulatory Visit (HOSPITAL_BASED_OUTPATIENT_CLINIC_OR_DEPARTMENT_OTHER): Payer: Self-pay

## 2023-05-03 ENCOUNTER — Other Ambulatory Visit (HOSPITAL_BASED_OUTPATIENT_CLINIC_OR_DEPARTMENT_OTHER): Payer: Self-pay

## 2023-05-03 ENCOUNTER — Ambulatory Visit: Payer: No Typology Code available for payment source | Admitting: Family

## 2023-05-03 VITALS — BP 117/79 | HR 76 | Temp 98.6°F | Resp 16 | Ht 65.0 in | Wt 151.0 lb

## 2023-05-03 DIAGNOSIS — F419 Anxiety disorder, unspecified: Secondary | ICD-10-CM

## 2023-05-03 DIAGNOSIS — M549 Dorsalgia, unspecified: Secondary | ICD-10-CM | POA: Diagnosis not present

## 2023-05-03 DIAGNOSIS — F32A Depression, unspecified: Secondary | ICD-10-CM | POA: Diagnosis not present

## 2023-05-03 MED ORDER — ESCITALOPRAM OXALATE 20 MG PO TABS
20.0000 mg | ORAL_TABLET | Freq: Every day | ORAL | 1 refills | Status: DC
  Filled 2023-05-03: qty 30, 30d supply, fill #0
  Filled 2023-06-13: qty 90, 90d supply, fill #0
  Filled 2023-08-13 – 2023-09-12 (×3): qty 90, 90d supply, fill #1

## 2023-05-03 MED ORDER — BUPROPION HCL ER (XL) 150 MG PO TB24
150.0000 mg | ORAL_TABLET | Freq: Every day | ORAL | 1 refills | Status: DC
  Filled 2023-05-03: qty 90, 90d supply, fill #0
  Filled 2023-06-13: qty 30, 30d supply, fill #0
  Filled 2023-08-13: qty 30, 30d supply, fill #1
  Filled 2023-09-09: qty 30, 30d supply, fill #2
  Filled 2023-09-12: qty 90, 90d supply, fill #2

## 2023-05-03 NOTE — Assessment & Plan Note (Signed)
New. Recommended ibupofen prn, follow up if worsening/no improvement.

## 2023-05-03 NOTE — Progress Notes (Signed)
Subjective:     Patient ID: Virginia Ayala, female    DOB: 04/24/1971, 52 y.o.   MRN: [id number]  Chief Complaint  Patient presents with   Depression    Here for follow up   Anxiety    Here for follow up    Depression        Past medical history includes anxiety.   Anxiety      Discussed the use of AI scribe software for clinical note transcription with the patient, who gave verbal consent to proceed.  History of Present Illness         Patient presents today for follow up of her medication.  She is maintained on lexapro, wellbutrin and prn xanax which she uses mainly at night for sleep. Usually only takes a 1/2 tablet.  She notes improvement in her mood, feels less irritable since her lexapro was increased from 10mg  to 20mg .   She also notes some mild soreness left lateral flank area.      Health Maintenance Due  Topic Date Due   COVID-19 Vaccine (3 - 2023-24 season) 03/03/2023    Past Medical History:  Diagnosis Date   Anxiety    Back pain    due to bulging disc lumbar spine   Depression     Past Surgical History:  Procedure Laterality Date   MOUTH SURGERY     in her 72's   ORTHOPEDIC SURGERY Left 1980    Family History  Problem Relation Age of Onset   Diabetes Father    Pulmonary embolism Father    Cancer Maternal Grandmother    Heart disease Maternal Grandmother    Heart disease Paternal Grandfather    Crohn's disease Daughter    Pulmonary embolism Paternal Aunt     Social History   Socioeconomic History   Marital status: Divorced    Spouse name: Not on file   Number of children: Not on file   Years of education: Not on file   Highest education level: Not on file  Occupational History   Not on file  Tobacco Use   Smoking status: Never   Smokeless tobacco: Never  Vaping Use   Vaping status: Never Used  Substance and Sexual Activity   Alcohol use: Not Currently   Drug use: Never   Sexual activity: Not Currently    Birth  control/protection: None  Other Topics Concern   Not on file  Social History Narrative   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   Divorced   2 dogs (Runner, broadcasting/film/video)    Social Determinants of Health   Financial Resource Strain: Not on file  Food Insecurity: Not on file  Transportation Needs: Not on file  Physical Activity: Unknown (05/20/2019)   Exercise Vital Sign    Days of Exercise per Week: 0 days    Minutes of Exercise per Session: Not on file  Stress: Not on file  Social Connections: Socially Isolated (05/20/2019)   Social Connection and Isolation Panel [NHANES]    Frequency of Communication with Friends and Family: More than three times a week    Frequency of Social Gatherings with Friends and Family: Once a week    Attends Religious Services: Never    Database administrator or Organizations: No    Attends Banker Meetings: Never    Marital Status: Separated  Intimate Partner Violence: Not At Risk (05/20/2019)   Humiliation, Afraid, Rape, and Kick questionnaire  Fear of Current or Ex-Partner: No    Emotionally Abused: No    Physically Abused: No    Sexually Abused: No    Outpatient Medications Prior to Visit  Medication Sig Dispense Refill   ALPRAZolam (XANAX) 0.5 MG tablet Take 1 tablet (0.5 mg total) by mouth at bedtime as needed. For anxiety 30 tablet 0   ibuprofen (ADVIL) 600 MG tablet Take 1 tablet (600 mg total) by mouth every 8 (eight) hours as needed for moderate pain (use as directed). 30 tablet 1   buPROPion (WELLBUTRIN XL) 150 MG 24 hr tablet TAKE 1 TABLET DAILY 90 tablet 0   escitalopram (LEXAPRO) 20 MG tablet TAKE 1 TABLET DAILY 90 tablet 0   No facility-administered medications prior to visit.    Allergies  Allergen Reactions   Sulfa Antibiotics Shortness Of Breath   Lodine [Etodolac] Itching   Penicillins Rash    Review of Systems  Psychiatric/Behavioral:  Positive for depression.    See HPI     Objective:    Physical Exam Constitutional:      General: She is not in acute distress.    Appearance: Normal appearance. She is well-developed.  HENT:     Head: Normocephalic and atraumatic.     Right Ear: External ear normal.     Left Ear: External ear normal.  Eyes:     General: No scleral icterus. Neck:     Thyroid: No thyromegaly.  Cardiovascular:     Rate and Rhythm: Normal rate and regular rhythm.     Heart sounds: Normal heart sounds. No murmur heard. Pulmonary:     Effort: Pulmonary effort is normal. No respiratory distress.     Breath sounds: Normal breath sounds. No wheezing.  Abdominal:     General: There is no distension.     Palpations: Abdomen is soft.     Tenderness: There is no abdominal tenderness. There is no right CVA tenderness or left CVA tenderness.  Musculoskeletal:     Cervical back: Neck supple.  Skin:    General: Skin is warm and dry.  Neurological:     Mental Status: She is alert and oriented to person, place, and time.  Psychiatric:        Mood and Affect: Mood normal.        Behavior: Behavior normal.        Thought Content: Thought content normal.        Judgment: Judgment normal.      BP 117/79 (BP Location: Right Arm, Patient Position: Sitting, Cuff Size: Normal)   Pulse 76   Temp 98.6 F (37 C) (Oral)   Resp 16   Ht 5\' 5"  (1.651 m)   Wt 151 lb (68.5 kg)   LMP 08/31/2018   SpO2 98%   BMI 25.13 kg/m  Wt Readings from Last 3 Encounters:  05/03/23 151 lb (68.5 kg)  01/29/23 168 lb (76.2 kg)  07/31/22 183 lb (83 kg)       Assessment & Plan:   Problem List Items Addressed This Visit       Unprioritized   Musculoskeletal back pain - Primary    New. Recommended ibupofen prn, follow up if worsening/no improvement.      Depression    Improved. Continue lexapro/wellbutrin.      Relevant Medications   escitalopram (LEXAPRO) 20 MG tablet   buPROPion (WELLBUTRIN XL) 150 MG 24 hr tablet   Anxiety    Stable on current  meds.  Updated controlled  substance contract.        Relevant Medications   escitalopram (LEXAPRO) 20 MG tablet   buPROPion (WELLBUTRIN XL) 150 MG 24 hr tablet    I have changed Virginia Ayala's escitalopram and buPROPion. I am also having her maintain her ibuprofen and ALPRAZolam.  Meds ordered this encounter  Medications   escitalopram (LEXAPRO) 20 MG tablet    Sig: Take 1 tablet (20 mg total) by mouth daily.    Dispense:  90 tablet    Refill:  1    Order Specific Question:   Supervising Provider    Answer:   Danise Edge A [[id number]]   buPROPion (WELLBUTRIN XL) 150 MG 24 hr tablet    Sig: Take 1 tablet (150 mg total) by mouth daily.    Dispense:  90 tablet    Refill:  1    Order Specific Question:   Supervising Provider    Answer:   Danise Edge A [[id number]]

## 2023-05-03 NOTE — Progress Notes (Signed)
Subjective:     Patient ID: Virginia Ayala, female    DOB: [birth year], 52 y.o.   MRN: [id number]  Chief Complaint  Patient presents with   Depression    Here for follow up   Anxiety    Here for follow up    Depression        Associated symptoms include headaches.  Past medical history includes anxiety.   Anxiety Symptoms include nausea. Patient reports no chest pain, dizziness, palpitations or shortness of breath.      Discussed the use of AI scribe software for clinical note transcription with the patient, who gave verbal consent to proceed.    Patient is here today for follow up for depression and anxiety. She states that she tried to reduce lexapro but due to the reduction made her worse. So she is back on 20mg  of Lexapro per Melissa. Due to the reduction on the lexapro she states she felt her anxiety went up and she states that she felt more irritated and more sleepy. But now she is back to her normal baseline.  She states that she does not sleep well but this is normal for her. She states that she does take the Xanax to help her sleep at night. She states that when she does take the Xanax she is able to sleep fine with no issues falling asleep or waking up.   Diet is doing well. [name] states that due to her bad back she does walk as much as she can. She states that she is trying her best to lose weight. Hydration is adequate.     Health Maintenance Due  Topic Date Due   COVID-19 Vaccine (3 - 2023-24 season) 03/03/2023    Past Medical History:  Diagnosis Date   Anxiety    Back pain    due to bulging disc lumbar spine   Depression     Past Surgical History:  Procedure Laterality Date   MOUTH SURGERY     in her 88's   ORTHOPEDIC SURGERY Left 1980    Family History  Problem Relation Age of Onset   Diabetes Father    Pulmonary embolism Father    Cancer Maternal Grandmother    Heart disease Maternal Grandmother    Heart disease Paternal Grandfather     Crohn's disease Daughter    Pulmonary embolism Paternal Aunt     Social History   Socioeconomic History   Marital status: Divorced    Spouse name: Not on file   Number of children: Not on file   Years of education: Not on file   Highest education level: Not on file  Occupational History   Not on file  Tobacco Use   Smoking status: Never   Smokeless tobacco: Never  Vaping Use   Vaping status: Never Used  Substance and Sexual Activity   Alcohol use: Not Currently   Drug use: Never   Sexual activity: Not Currently    Birth control/protection: None  Other Topics Concern   Not on file  Social History Narrative   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   Divorced   2 dogs (Runner, broadcasting/film/video)    Social Determinants of Health   Financial Resource Strain: Not on file  Food Insecurity: Not on file  Transportation Needs: Not on file  Physical Activity: Unknown (05/20/2019)   Exercise Vital Sign    Days of Exercise per Week: 0 days    Minutes of  Exercise per Session: Not on file  Stress: Not on file  Social Connections: Socially Isolated (05/20/2019)   Social Connection and Isolation Panel [NHANES]    Frequency of Communication with Friends and Family: More than three times a week    Frequency of Social Gatherings with Friends and Family: Once a week    Attends Religious Services: Never    Database administrator or Organizations: No    Attends Banker Meetings: Never    Marital Status: Separated  Intimate Partner Violence: Not At Risk (05/20/2019)   Humiliation, Afraid, Rape, and Kick questionnaire    Fear of Current or Ex-Partner: No    Emotionally Abused: No    Physically Abused: No    Sexually Abused: No    Outpatient Medications Prior to Visit  Medication Sig Dispense Refill   ALPRAZolam (XANAX) 0.5 MG tablet Take 1 tablet (0.5 mg total) by mouth at bedtime as needed. For anxiety 30 tablet 0   buPROPion (WELLBUTRIN XL) 150 MG 24 hr  tablet TAKE 1 TABLET DAILY 90 tablet 0   escitalopram (LEXAPRO) 20 MG tablet TAKE 1 TABLET DAILY 90 tablet 0   ibuprofen (ADVIL) 600 MG tablet Take 1 tablet (600 mg total) by mouth every 8 (eight) hours as needed for moderate pain (use as directed). 30 tablet 1   No facility-administered medications prior to visit.    Allergies  Allergen Reactions   Sulfa Antibiotics Shortness Of Breath   Lodine [Etodolac] Itching   Penicillins Rash    Review of Systems  Constitutional:  Negative for chills, fever, malaise/fatigue and weight loss.  HENT:  Negative for congestion, ear pain and sore throat.   Eyes:  Negative for pain.  Respiratory:  Negative for cough, shortness of breath and wheezing.   Cardiovascular:  Negative for chest pain and palpitations.  Gastrointestinal:  Positive for nausea. Negative for diarrhea and vomiting.  Musculoskeletal:  Positive for back pain.  Skin:  Negative for itching and rash.  Neurological:  Positive for headaches. Negative for dizziness.  Psychiatric/Behavioral:  Positive for depression.        Objective:    Physical Exam Vitals reviewed.  Constitutional:      Appearance: Normal appearance. She is normal weight.  HENT:     Head: Normocephalic.  Cardiovascular:     Rate and Rhythm: Normal rate and regular rhythm.     Pulses: Normal pulses.     Heart sounds: Normal heart sounds.  Pulmonary:     Effort: Pulmonary effort is normal.     Breath sounds: Normal breath sounds.  Musculoskeletal:        General: Normal range of motion.     Cervical back: Normal range of motion.  Skin:    General: Skin is warm.  Neurological:     General: No focal deficit present.     Mental Status: She is alert and oriented to person, place, and time. Mental status is at baseline.  Psychiatric:        Mood and Affect: Mood normal.        Behavior: Behavior normal.        Thought Content: Thought content normal.        Judgment: Judgment normal.      BP 117/79  (BP Location: Right Arm, Patient Position: Sitting, Cuff Size: Normal)   Pulse 76   Temp 98.6 F (37 C) (Oral)   Resp 16   Ht 5\' 5"  (1.651 m)   Wt 151  lb (68.5 kg)   LMP 08/31/2018   SpO2 98%   BMI 25.13 kg/m  Wt Readings from Last 3 Encounters:  05/03/23 151 lb (68.5 kg)  01/29/23 168 lb (76.2 kg)  07/31/22 183 lb (83 kg)       Assessment & Plan:   Problem List Items Addressed This Visit   None   I am having Virginia Ayala maintain her ibuprofen, escitalopram, buPROPion, and ALPRAZolam.  No orders of the defined types were placed in this encounter.

## 2023-05-03 NOTE — Assessment & Plan Note (Signed)
Improved. Continue lexapro/wellbutrin.

## 2023-05-03 NOTE — Assessment & Plan Note (Signed)
Stable on current meds.  Updated controlled substance contract.

## 2023-05-16 ENCOUNTER — Other Ambulatory Visit: Payer: Self-pay | Admitting: Family

## 2023-06-13 ENCOUNTER — Encounter: Payer: Self-pay | Admitting: Pharmacist

## 2023-06-13 ENCOUNTER — Other Ambulatory Visit (HOSPITAL_COMMUNITY): Payer: Self-pay

## 2023-06-13 ENCOUNTER — Other Ambulatory Visit: Payer: Self-pay

## 2023-06-17 ENCOUNTER — Other Ambulatory Visit (HOSPITAL_COMMUNITY): Payer: Self-pay

## 2023-07-01 ENCOUNTER — Other Ambulatory Visit (HOSPITAL_BASED_OUTPATIENT_CLINIC_OR_DEPARTMENT_OTHER): Payer: Self-pay

## 2023-07-01 ENCOUNTER — Other Ambulatory Visit: Payer: Self-pay | Admitting: Family

## 2023-07-01 MED ORDER — ALPRAZOLAM 0.5 MG PO TABS
0.5000 mg | ORAL_TABLET | Freq: Every evening | ORAL | 0 refills | Status: DC | PRN
  Filled 2023-07-01: qty 30, 30d supply, fill #0

## 2023-07-01 NOTE — Telephone Encounter (Signed)
Requesting: alprazolam 0.5mg  Contract: 06/28/23 UDS: 07/31/22 Last Visit: 05/03/23 Next Visit: 11/01/23 Last Refill: 03/06/23 #30 and 1HY   Please Advise

## 2023-08-13 ENCOUNTER — Other Ambulatory Visit (HOSPITAL_COMMUNITY): Payer: Self-pay

## 2023-08-14 ENCOUNTER — Other Ambulatory Visit: Payer: Self-pay

## 2023-08-14 ENCOUNTER — Other Ambulatory Visit (HOSPITAL_COMMUNITY): Payer: Self-pay

## 2023-08-14 ENCOUNTER — Other Ambulatory Visit: Payer: Self-pay | Admitting: Family

## 2023-08-19 ENCOUNTER — Other Ambulatory Visit: Payer: Self-pay | Admitting: Family

## 2023-08-19 ENCOUNTER — Encounter (HOSPITAL_BASED_OUTPATIENT_CLINIC_OR_DEPARTMENT_OTHER): Payer: Self-pay

## 2023-08-19 ENCOUNTER — Other Ambulatory Visit (HOSPITAL_BASED_OUTPATIENT_CLINIC_OR_DEPARTMENT_OTHER): Payer: Self-pay

## 2023-08-22 ENCOUNTER — Encounter: Payer: Self-pay | Admitting: Family

## 2023-08-22 MED ORDER — ALPRAZOLAM 0.5 MG PO TABS: 0.5000 mg | ORAL_TABLET | Freq: Every evening | ORAL | 0 refills | Status: DC | PRN

## 2023-08-22 NOTE — Telephone Encounter (Signed)
 Requesting: alprazlam 0.5mg  Contract: 06/28/23 UDS: 07/31/22 Last Visit: 05/03/23 Next Visit: 11/01/23 Last Refill: 07/01/23 #30 and 0RF   Please Advise

## 2023-09-12 ENCOUNTER — Encounter: Payer: Self-pay | Admitting: Family

## 2023-09-12 ENCOUNTER — Other Ambulatory Visit (HOSPITAL_COMMUNITY): Payer: Self-pay

## 2023-09-12 ENCOUNTER — Other Ambulatory Visit: Payer: Self-pay

## 2023-09-12 MED ORDER — BUPROPION HCL ER (XL) 150 MG PO TB24: 150.0000 mg | ORAL_TABLET | Freq: Every day | ORAL | 1 refills | Status: DC

## 2023-09-16 MED ORDER — ESCITALOPRAM OXALATE 20 MG PO TABS: 20.0000 mg | ORAL_TABLET | Freq: Every day | ORAL | 1 refills | Status: DC

## 2023-09-25 ENCOUNTER — Ambulatory Visit: Admitting: Family

## 2023-09-25 ENCOUNTER — Other Ambulatory Visit (HOSPITAL_BASED_OUTPATIENT_CLINIC_OR_DEPARTMENT_OTHER): Payer: Self-pay

## 2023-09-25 VITALS — BP 108/74 | HR 82 | Temp 97.6°F | Resp 16 | Ht 65.0 in | Wt 147.0 lb

## 2023-09-25 DIAGNOSIS — Z1231 Encounter for screening mammogram for malignant neoplasm of breast: Secondary | ICD-10-CM

## 2023-09-25 DIAGNOSIS — M5416 Radiculopathy, lumbar region: Secondary | ICD-10-CM | POA: Diagnosis not present

## 2023-09-25 MED ORDER — METHYLPREDNISOLONE 4 MG PO TBPK
ORAL_TABLET | ORAL | 0 refills | Status: DC
  Filled 2023-09-25: qty 21, 6d supply, fill #0

## 2023-09-25 MED ORDER — METHOCARBAMOL 500 MG PO TABS
500.0000 mg | ORAL_TABLET | Freq: Three times a day (TID) | ORAL | 0 refills | Status: DC | PRN
  Filled 2023-09-25: qty 20, 7d supply, fill #0

## 2023-09-25 NOTE — Patient Instructions (Signed)
 Start robaxin and medrol dose pak. You should be contacted by neurosurgery about scheduling your appointment.

## 2023-09-25 NOTE — Assessment & Plan Note (Signed)
  Chronic lumbar disc disease with degenerative changes at L5-S1. Acute exacerbation with left leg and right calf pain. Previous MRI confirmed degenerative changes. Current analgesics ineffective. Steroid therapy anticipated for short-term relief.  - Prescribe Medrol Dosepak (21 tablets) as per package instructions. - Prescribe muscle relaxant Robaxin with caution to avoid driving after use. - Refer to neurosurgery for further evaluation and management.

## 2023-09-25 NOTE — Progress Notes (Signed)
   Established Patient Office Visit  Subjective   Patient ID: Virginia Ayala, female    DOB: 09-Nov-1970  Age: 53 y.o. MRN: [id number]  Chief Complaint  Patient presents with   Back Pain    Complains of lower back pain radiating down right leg    [name] patient presents with c/o acute exacerbation of chronic back pain. Onset Sunday. Denies new injury but felt it worsen while cleaning. Pain is constant and worsens with movement/bending and palpation. Pain over lumbosacral region down lateral aspect of left upper leg and right lower leg. Lying supine is the only intervention that improves symptoms. Has been taking Motrin and Tylenol with no improvement. Patient denies urinary symptoms or urinary/bowel incontinence.  Back Pain Associated symptoms include weakness.     Review of Systems  Gastrointestinal:        Negative for bowel incontinence  Genitourinary: Negative.   Musculoskeletal:  Positive for back pain. Negative for falls.       See HPI  Neurological:  Positive for weakness. Negative for sensory change.       Slight left lower leg weakness with flexion and extension.       Objective:     BP 108/74 (BP Location: Right Arm, Patient Position: Sitting, Cuff Size: Small)   Pulse 82   Temp 97.6 F (36.4 C) (Oral)   Resp 16   Ht 5\' 5"  (1.651 m)   Wt 147 lb (66.7 kg)   LMP 08/31/2018   SpO2 97%   BMI 24.46 kg/m    Physical Exam Vitals reviewed.  Constitutional:      Appearance: Normal appearance.  HENT:     Head: Normocephalic and atraumatic.     Right Ear: External ear normal.     Left Ear: External ear normal.  Cardiovascular:     Rate and Rhythm: Normal rate and regular rhythm.  Pulmonary:     Effort: Pulmonary effort is normal.     Breath sounds: Normal breath sounds.  Musculoskeletal:        General: Tenderness present.     Comments: Point tenderness over lumbosacral spine  Skin:    General: Skin is warm and dry.     Capillary Refill:  Capillary refill takes less than 2 seconds.  Neurological:     Mental Status: She is alert and oriented to person, place, and time.     Motor: Weakness present.     Deep Tendon Reflexes: Reflexes abnormal.     Comments: Noted left lower extremity weakness with flexion and extension. Bilateral 3+ patellar hyperreflexia.   Psychiatric:        Mood and Affect: Mood normal.        Behavior: Behavior normal.        Thought Content: Thought content normal.      Assessment & Plan:   Problem List Items Addressed This Visit   None  -Lumbar radiculopathy - acute exacerbation. Will start Medrol Dosepak, and methocarbamol. Referral to neurosurgery.   -Depression - stable on bupropion and escitalopram. No recent concerns.  -Anxiety - stable on PRN alprazolam.    Cristopher Peru, RN

## 2023-09-25 NOTE — Progress Notes (Signed)
 Subjective:     Patient ID: Virginia Ayala, female    DOB: Sep 18, 1970, 53 y.o.   MRN: [id number]  Chief Complaint  Patient presents with   Back Pain    Complains of lower back pain radiating down right leg    Back Pain    Discussed the use of AI scribe software for clinical note transcription with the patient, who gave verbal consent to proceed.  History of Present Illness  Virginia Ayala is a 53 year old female with chronic lower back pain who presents with acute exacerbation of back pain radiating to the left leg.  She has been experiencing chronic lower back pain, which has recently exacerbated, with the pain now radiating down the left leg and causing discomfort in the right calf. This episode began on Sunday. No bowel or bladder issues are present.  An MRI conducted in 2020 showed degenerative changes at the L5-S1 level, which correlate with her symptoms.  She has been managing her pain with Tylenol and Motrin, but this regimen has not been effective for the current exacerbation. She is cautious about using narcotics and prefers to avoid them if possible.  She has a history of weight loss, initially achieved through [employer] and later by eliminating carbohydrates from her diet.     Health Maintenance Due  Topic Date Due   COVID-19 Vaccine (3 - 2024-25 season) 03/03/2023    Past Medical History:  Diagnosis Date   Anxiety    Back pain    due to bulging disc lumbar spine   Depression     Past Surgical History:  Procedure Laterality Date   MOUTH SURGERY     in her 8's   ORTHOPEDIC SURGERY Left 1980    Family History  Problem Relation Age of Onset   Diabetes Father    Pulmonary embolism Father    Cancer Maternal Grandmother    Heart disease Maternal Grandmother    Heart disease Paternal Grandfather    Crohn's disease Daughter    Pulmonary embolism Paternal Aunt     Social History   Socioeconomic History   Marital status: Divorced    Spouse name:  Not on file   Number of children: Not on file   Years of education: Not on file   Highest education level: Not on file  Occupational History   Not on file  Tobacco Use   Smoking status: Never   Smokeless tobacco: Never  Vaping Use   Vaping status: Never Used  Substance and Sexual Activity   Alcohol use: Not Currently   Drug use: Never   Sexual activity: Not Currently    Birth control/protection: None  Other Topics Concern   Not on file  Social History Narrative   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   Divorced   2 dogs (Runner, broadcasting/film/video)    Social Drivers of Corporate investment banker Strain: Not on file  Food Insecurity: Not on file  Transportation Needs: Not on file  Physical Activity: Unknown (05/20/2019)   Exercise Vital Sign    Days of Exercise per Week: 0 days    Minutes of Exercise per Session: Not on file  Stress: Not on file  Social Connections: Socially Isolated (05/20/2019)   Social Connection and Isolation Panel [NHANES]    Frequency of Communication with Friends and Family: More than three times a week    Frequency of Social Gatherings with Friends and Family: Once a week  Attends Religious Services: Never    Active Member of Clubs or Organizations: No    Attends Banker Meetings: Never    Marital Status: Separated  Intimate Partner Violence: Not At Risk (05/20/2019)   Humiliation, Afraid, Rape, and Kick questionnaire    Fear of Current or Ex-Partner: No    Emotionally Abused: No    Physically Abused: No    Sexually Abused: No    Outpatient Medications Prior to Visit  Medication Sig Dispense Refill   ALPRAZolam (XANAX) 0.5 MG tablet Take 1 tablet (0.5 mg total) by mouth at bedtime as needed. For anxiety 30 tablet 0   buPROPion (WELLBUTRIN XL) 150 MG 24 hr tablet Take 1 tablet (150 mg total) by mouth daily. 90 tablet 1   escitalopram (LEXAPRO) 20 MG tablet Take 1 tablet (20 mg total) by mouth daily. 90 tablet 1    ibuprofen (ADVIL) 600 MG tablet Take 1 tablet (600 mg total) by mouth every 8 (eight) hours as needed for moderate pain (use as directed). 30 tablet 1   No facility-administered medications prior to visit.    Allergies  Allergen Reactions   Sulfa Antibiotics Shortness Of Breath   Lodine [Etodolac] Itching   Penicillins Rash    Review of Systems  Musculoskeletal:  Positive for back pain.       Objective:    Physical Exam Constitutional:      General: She is not in acute distress.    Appearance: Normal appearance. She is well-developed.  HENT:     Head: Normocephalic and atraumatic.     Right Ear: External ear normal.     Left Ear: External ear normal.  Eyes:     General: No scleral icterus. Neck:     Thyroid: No thyromegaly.  Cardiovascular:     Rate and Rhythm: Normal rate and regular rhythm.     Heart sounds: Normal heart sounds. No murmur heard. Pulmonary:     Effort: Pulmonary effort is normal. No respiratory distress.     Breath sounds: Normal breath sounds. No wheezing.  Musculoskeletal:     Cervical back: Neck supple.     Lumbar back: Tenderness present. No edema.  Skin:    General: Skin is warm and dry.  Neurological:     Mental Status: She is alert and oriented to person, place, and time.     Deep Tendon Reflexes:     Reflex Scores:      Patellar reflexes are 3+ on the right side and 3+ on the left side.    Comments: LLE strength 4/5 RLE Strength 5/5  Psychiatric:        Mood and Affect: Mood normal.        Behavior: Behavior normal.        Thought Content: Thought content normal.        Judgment: Judgment normal.      BP 108/74 (BP Location: Right Arm, Patient Position: Sitting, Cuff Size: Small)   Pulse 82   Temp 97.6 F (36.4 C) (Oral)   Resp 16   Ht 5\' 5"  (1.651 m)   Wt 147 lb (66.7 kg)   LMP 08/31/2018   SpO2 97%   BMI 24.46 kg/m  Wt Readings from Last 3 Encounters:  09/25/23 147 lb (66.7 kg)  05/03/23 151 lb (68.5 kg)  01/29/23  168 lb (76.2 kg)       Assessment & Plan:   Problem List Items Addressed This Visit  Unprioritized   Lumbar radiculopathy - Primary    Chronic lumbar disc disease with degenerative changes at L5-S1. Acute exacerbation with left leg and right calf pain. Previous MRI confirmed degenerative changes. Current analgesics ineffective. Steroid therapy anticipated for short-term relief.  - Prescribe Medrol Dosepak (21 tablets) as per package instructions. - Prescribe muscle relaxant Robaxin with caution to avoid driving after use. - Refer to neurosurgery for further evaluation and management.      Relevant Medications   methylPREDNISolone (MEDROL DOSEPAK) 4 MG TBPK tablet   methocarbamol (ROBAXIN) 500 MG tablet   Other Relevant Orders   Ambulatory referral to Neurosurgery   Other Visit Diagnoses       Breast cancer screening by mammogram       Relevant Orders   MM 3D SCREENING MAMMOGRAM BILATERAL BREAST       I am having Virginia Ayala start on methylPREDNISolone and methocarbamol. I am also having her maintain her ibuprofen, ALPRAZolam, buPROPion, and escitalopram.  Meds ordered this encounter  Medications   methylPREDNISolone (MEDROL DOSEPAK) 4 MG TBPK tablet    Sig: Take per package instructions    Dispense:  21 tablet    Refill:  0    Supervising Provider:   Danise Edge A [[id number]]   methocarbamol (ROBAXIN) 500 MG tablet    Sig: Take 1 tablet (500 mg total) by mouth every 8 (eight) hours as needed.    Dispense:  20 tablet    Refill:  0    Supervising Provider:   Danise Edge A [[id number]]

## 2023-10-03 ENCOUNTER — Ambulatory Visit (HOSPITAL_BASED_OUTPATIENT_CLINIC_OR_DEPARTMENT_OTHER)
Admission: RE | Admit: 2023-10-03 | Discharge: 2023-10-03 | Disposition: A | Discharge status: Home / Self Care | Source: Ambulatory Visit | Attending: Family | Admitting: Family

## 2023-10-03 ENCOUNTER — Encounter (HOSPITAL_BASED_OUTPATIENT_CLINIC_OR_DEPARTMENT_OTHER): Payer: Self-pay

## 2023-10-03 DIAGNOSIS — Z1231 Encounter for screening mammogram for malignant neoplasm of breast: Secondary | ICD-10-CM | POA: Insufficient documentation

## 2023-10-09 ENCOUNTER — Other Ambulatory Visit: Payer: Self-pay | Admitting: Family

## 2023-10-09 ENCOUNTER — Other Ambulatory Visit (HOSPITAL_BASED_OUTPATIENT_CLINIC_OR_DEPARTMENT_OTHER): Payer: Self-pay

## 2023-10-09 MED ORDER — ALPRAZOLAM 0.5 MG PO TABS
0.5000 mg | ORAL_TABLET | Freq: Every evening | ORAL | 0 refills | Status: DC | PRN
  Filled 2023-10-09: qty 30, 30d supply, fill #0

## 2023-10-09 NOTE — Telephone Encounter (Signed)
 Medication: Xanax  Contract: 06/28/2023     UDS: 07/31/22 Last visit: 09/25/2023 Last refill: 08/22/2023 Next visit: 11/01/2023

## 2023-10-10 ENCOUNTER — Other Ambulatory Visit: Payer: Self-pay

## 2023-11-01 ENCOUNTER — Ambulatory Visit: Payer: No Typology Code available for payment source | Admitting: Family

## 2023-11-06 ENCOUNTER — Other Ambulatory Visit: Payer: Self-pay | Admitting: Family

## 2023-11-06 NOTE — Telephone Encounter (Signed)
 Requesting: alprazolam  0.5mg   Contract: 06/28/23 UDS: 07/31/22 Last Visit: 09/25/23 Next Visit: 12/06/23 Last Refill: 10/09/23 #30 and 0RF   Please Advise

## 2023-11-07 ENCOUNTER — Other Ambulatory Visit (HOSPITAL_BASED_OUTPATIENT_CLINIC_OR_DEPARTMENT_OTHER): Payer: Self-pay

## 2023-11-07 ENCOUNTER — Other Ambulatory Visit: Payer: Self-pay

## 2023-11-07 MED ORDER — ALPRAZOLAM 0.5 MG PO TABS
0.5000 mg | ORAL_TABLET | Freq: Every evening | ORAL | 0 refills | Status: DC | PRN
  Filled 2023-11-07: qty 30, 30d supply, fill #0

## 2023-12-06 ENCOUNTER — Ambulatory Visit: Admitting: Family

## 2023-12-13 ENCOUNTER — Other Ambulatory Visit (HOSPITAL_BASED_OUTPATIENT_CLINIC_OR_DEPARTMENT_OTHER): Payer: Self-pay

## 2023-12-13 ENCOUNTER — Ambulatory Visit: Admitting: Family

## 2023-12-13 VITALS — BP 116/78 | HR 71 | Temp 98.1°F | Resp 16 | Ht 65.0 in | Wt 151.0 lb

## 2023-12-13 DIAGNOSIS — D234 Other benign neoplasm of skin of scalp and neck: Secondary | ICD-10-CM | POA: Diagnosis not present

## 2023-12-13 DIAGNOSIS — F419 Anxiety disorder, unspecified: Secondary | ICD-10-CM

## 2023-12-13 DIAGNOSIS — F32A Depression, unspecified: Secondary | ICD-10-CM

## 2023-12-13 MED ORDER — ALPRAZOLAM 0.5 MG PO TABS
0.5000 mg | ORAL_TABLET | Freq: Every evening | ORAL | 0 refills | Status: DC | PRN
  Filled 2023-12-13: qty 30, 30d supply, fill #0

## 2023-12-13 MED ORDER — BUPROPION HCL ER (XL) 300 MG PO TB24: 300.0000 mg | ORAL_TABLET | Freq: Every day | ORAL | 0 refills | Status: DC

## 2023-12-13 NOTE — Progress Notes (Signed)
 Subjective:     Patient ID: Virginia Ayala, female    DOB: 1970-07-29, 53 y.o.   MRN: [id number]  Chief Complaint  Patient presents with   Anxiety    Here for follow up   Depression    Here for follow up    Anxiety    Depression        Past medical history includes anxiety.     Discussed the use of AI scribe software for clinical note transcription with the patient, who gave verbal consent to proceed.  History of Present Illness  Virginia Ayala is a 53 year old female who presents with low mood and increased anxiety.  She has experienced low mood for about a month, coinciding with conflicts with her sister while planning her mother's birthday. She often lies down after work and remains inactive until bedtime. Anxiety has increased, with frequent use of Xanax  due to near panic attacks over minor stressors. She engages in stress eating, consuming food even when not hungry.  Her physical activity is reduced due to a back injury, limiting her ability to walk her dogs. Although her back pain has improved, it continues to affect her mood. She struggles to get out of bed in the morning, often resulting in tardiness for work. She works from home, starting at 7:30 AM, but often rises at the same time. She is concerned about her boss's understanding of her situation.     Health Maintenance Due  Topic Date Due   COVID-19 Vaccine (3 - 2024-25 season) 03/03/2023    Past Medical History:  Diagnosis Date   Anxiety    Back pain    due to bulging disc lumbar spine   Depression     Past Surgical History:  Procedure Laterality Date   MOUTH SURGERY     in her 78's   ORTHOPEDIC SURGERY Left 1980    Family History  Problem Relation Age of Onset   Diabetes Father    Pulmonary embolism Father    Cancer Maternal Grandmother    Heart disease Maternal Grandmother    Heart disease Paternal Grandfather    Crohn's disease Daughter    Pulmonary embolism Paternal Aunt     Social  History   Socioeconomic History   Marital status: Divorced    Spouse name: Not on file   Number of children: Not on file   Years of education: Not on file   Highest education level: Not on file  Occupational History   Not on file  Tobacco Use   Smoking status: Never   Smokeless tobacco: Never  Vaping Use   Vaping status: Never Used  Substance and Sexual Activity   Alcohol use: Not Currently   Drug use: Never   Sexual activity: Not Currently    Birth control/protection: None  Other Topics Concern   Not on file  Social History Narrative   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   Divorced   2 dogs (Runner, broadcasting/film/video)    Social Drivers of Corporate investment banker Strain: Not on file  Food Insecurity: Not on file  Transportation Needs: Not on file  Physical Activity: Unknown (05/20/2019)   Exercise Vital Sign    Days of Exercise per Week: 0 days    Minutes of Exercise per Session: Not on file  Stress: Not on file  Social Connections: Socially Isolated (05/20/2019)   Social Connection and Isolation Panel    Frequency of Communication  with Friends and Family: More than three times a week    Frequency of Social Gatherings with Friends and Family: Once a week    Attends Religious Services: Never    Database administrator or Organizations: No    Attends Banker Meetings: Never    Marital Status: Separated  Intimate Partner Violence: Not At Risk (05/20/2019)   Humiliation, Afraid, Rape, and Kick questionnaire    Fear of Current or Ex-Partner: No    Emotionally Abused: No    Physically Abused: No    Sexually Abused: No    Outpatient Medications Prior to Visit  Medication Sig Dispense Refill   escitalopram  (LEXAPRO ) 20 MG tablet Take 1 tablet (20 mg total) by mouth daily. 90 tablet 1   ibuprofen  (ADVIL ) 600 MG tablet Take 1 tablet (600 mg total) by mouth every 8 (eight) hours as needed for moderate pain (use as directed). 30 tablet 1    ALPRAZolam  (XANAX ) 0.5 MG tablet Take 1 tablet (0.5 mg total) by mouth at bedtime as needed. For anxiety 30 tablet 0   buPROPion  (WELLBUTRIN  XL) 150 MG 24 hr tablet Take 1 tablet (150 mg total) by mouth daily. 90 tablet 1   methocarbamol  (ROBAXIN ) 500 MG tablet Take 1 tablet (500 mg total) by mouth every 8 (eight) hours as needed. 20 tablet 0   methylPREDNISolone  (MEDROL  DOSEPAK) 4 MG TBPK tablet Take per package instructions 21 tablet 0   No facility-administered medications prior to visit.    Allergies  Allergen Reactions   Sulfa Antibiotics Shortness Of Breath   Lodine [Etodolac] Itching   Penicillins Rash    Review of Systems  Psychiatric/Behavioral:  Positive for depression.    See HPI    Objective:    Physical Exam Constitutional:      Appearance: Normal appearance.   Cardiovascular:     Rate and Rhythm: Normal rate.  Pulmonary:     Effort: Pulmonary effort is normal.   Skin:    General: Skin is warm and dry.     Comments: 2-3 cm mobile non-tender subcutaneous cyst noted midline scalp near crown of head   Neurological:     Mental Status: She is alert and oriented to person, place, and time.   Psychiatric:        Mood and Affect: Mood normal.        Behavior: Behavior normal.        Thought Content: Thought content normal.        Judgment: Judgment normal.      BP 116/78 (BP Location: Right Arm, Patient Position: Sitting, Cuff Size: Normal)   Pulse 71   Temp 98.1 F (36.7 C) (Oral)   Resp 16   Ht 5' 5 (1.651 m)   Wt 151 lb (68.5 kg)   LMP 08/31/2018   SpO2 100%   BMI 25.13 kg/m  Wt Readings from Last 3 Encounters:  12/13/23 151 lb (68.5 kg)  09/25/23 147 lb (66.7 kg)  05/03/23 151 lb (68.5 kg)       Assessment & Plan:   Problem List Items Addressed This Visit       Unprioritized   Dermoid cyst of scalp    Small, non-painful cyst on arm for three months, not bothersome. - Monitor the cyst for changes in size or signs of infection.       Depression - Primary    Persistent low mood for a month, difficulty in daily activities. Stress from family and work  may contribute. Discussed counseling and FMLA for work accommodations. - Increase Wellbutrin  to 300 mg daily. - Provide information on local counselors and encourage consideration of counseling. - Discuss FMLA for depression-related work accommodations. - Schedule follow-up in six weeks to reassess symptoms and treatment efficacy.       Relevant Medications   buPROPion  (WELLBUTRIN  XL) 300 MG 24 hr tablet   ALPRAZolam  (XANAX ) 0.5 MG tablet   Anxiety    Increased Xanax  use due to heightened anxiety and panic attack-like symptoms. Acknowledged impact on daily life. - Refill Xanax  prescription. - Encourage consideration of counseling for anxiety management. - Contract up to date. Reviewed PDMP Aware.      Relevant Medications   buPROPion  (WELLBUTRIN  XL) 300 MG 24 hr tablet   ALPRAZolam  (XANAX ) 0.5 MG tablet    I have discontinued Fraida Fralix's buPROPion , methylPREDNISolone , and methocarbamol . I am also having her start on buPROPion . Additionally, I am having her maintain her ibuprofen , escitalopram , and ALPRAZolam .  Meds ordered this encounter  Medications   buPROPion  (WELLBUTRIN  XL) 300 MG 24 hr tablet    Sig: Take 1 tablet (300 mg total) by mouth daily.    Dispense:  90 tablet    Refill:  0    Supervising Provider:   Randie Bustle A [[id number]]   ALPRAZolam  (XANAX ) 0.5 MG tablet    Sig: Take 1 tablet (0.5 mg total) by mouth at bedtime as needed. For anxiety    Dispense:  30 tablet    Refill:  0    Supervising Provider:   Randie Bustle A [[id number]]

## 2023-12-13 NOTE — Assessment & Plan Note (Signed)
  Increased Xanax  use due to heightened anxiety and panic attack-like symptoms. Acknowledged impact on daily life. - Refill Xanax  prescription. - Encourage consideration of counseling for anxiety management. - Contract up to date. Reviewed PDMP Aware.

## 2023-12-13 NOTE — Assessment & Plan Note (Signed)
  Small, non-painful cyst on arm for three months, not bothersome. - Monitor the cyst for changes in size or signs of infection.

## 2023-12-13 NOTE — Patient Instructions (Signed)
 VISIT SUMMARY:  Today, we discussed your low mood, increased anxiety, and recent back pain. We also addressed a small cyst on your arm.  YOUR PLAN:  DEPRESSION: You have been experiencing a persistent low mood for about a month, which is affecting your daily activities. -Increase Wellbutrin  to 300 mg daily. -Consider seeing a counselor; information on local counselors will be provided. -Discuss FMLA for work accommodations related to depression. -Schedule a follow-up appointment in six weeks to reassess your symptoms and treatment.  ANXIETY: Your anxiety has increased, leading to more frequent use of Xanax  and near panic attacks. -Refill Xanax  prescription. -Consider counseling for anxiety management.  BACK PAIN: You have had recent back pain after overexertion, which has improved but still requires caution. -Be mindful of your physical limits to prevent worsening your back pain.  SUBCUTANEOUS CYST: You have a small, non-painful cyst on your arm that has been present for three months. -Monitor the cyst for any changes in size or signs of infection.

## 2023-12-13 NOTE — Assessment & Plan Note (Signed)
  Persistent low mood for a month, difficulty in daily activities. Stress from family and work may contribute. Discussed counseling and FMLA for work accommodations. - Increase Wellbutrin  to 300 mg daily. - Provide information on local counselors and encourage consideration of counseling. - Discuss FMLA for depression-related work accommodations. - Schedule follow-up in six weeks to reassess symptoms and treatment efficacy.

## 2023-12-18 ENCOUNTER — Encounter: Payer: Self-pay | Admitting: Family

## 2023-12-20 ENCOUNTER — Other Ambulatory Visit (HOSPITAL_BASED_OUTPATIENT_CLINIC_OR_DEPARTMENT_OTHER): Payer: Self-pay

## 2024-01-24 ENCOUNTER — Ambulatory Visit: Admitting: Family

## 2024-01-24 DIAGNOSIS — F32A Depression, unspecified: Secondary | ICD-10-CM | POA: Diagnosis not present

## 2024-01-24 MED ORDER — BUPROPION HCL ER (XL) 300 MG PO TB24: 300.0000 mg | ORAL_TABLET | Freq: Every day | ORAL | 0 refills | Status: DC

## 2024-01-24 MED ORDER — ESCITALOPRAM OXALATE 20 MG PO TABS: 20.0000 mg | ORAL_TABLET | Freq: Every day | ORAL | 0 refills | Status: DC

## 2024-01-24 NOTE — Patient Instructions (Signed)
 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  YOUR PLAN:  MAJOR DEPRESSIVE DISORDER: Your mood has improved with the increased dosage of bupropion , although you still feel lethargic at times. The initial insomnia you experienced has resolved. -Continue taking bupropion  and Lexapro  (escitalopram ) at the current dosages. -You will receive 90-day refills for both medications through [facility]. -Schedule a follow-up appointment in 3 months. -Contact us  if your symptoms worsen.  RABIES EXPOSURE RISK: There is a rabies outbreak in [facility], which poses a risk to outdoor cats even if they are vaccinated. -Get a booster rabies vaccination for your outdoor cats.

## 2024-01-24 NOTE — Progress Notes (Signed)
 Subjective:     Patient ID: Virginia Ayala, female    DOB: [birth year], 53 y.o.   MRN: [id number]  No chief complaint on file.   HPI  Discussed the use of AI scribe software for clinical note transcription with the patient, who gave verbal consent to proceed.  History of Present Illness Virginia Ayala is a 53 year old female who presents for follow-up of depression management.  Her mood has improved with the increased bupropion  dosage from 150 mg to 300 mg, though she still experiences periods of inactivity, often resting on the couch after work. Initial side effects of insomnia from the dose adjustment have resolved. She now wakes up with the alarm but gets up closer to her departure time for work. She is currently taking bupropion  and Lexapro  20 mg. She has not pursued counseling, preferring to discuss her feelings with her children and mother, whom she considers her support system.   Health Maintenance Due  Topic Date Due   Hepatitis B Vaccines (1 of 3 - 19+ 3-dose series) Never done   COVID-19 Vaccine (3 - Moderna risk series) 01/22/2020    Past Medical History:  Diagnosis Date   Anxiety    Back pain    due to bulging disc lumbar spine   Depression     Past Surgical History:  Procedure Laterality Date   MOUTH SURGERY     in her 37's   ORTHOPEDIC SURGERY Left 1980    Family History  Problem Relation Age of Onset   Diabetes Father    Pulmonary embolism Father    Cancer Maternal Grandmother    Heart disease Maternal Grandmother    Heart disease Paternal Grandfather    Crohn's disease Daughter    Pulmonary embolism Paternal Aunt     Social History   Socioeconomic History   Marital status: Divorced    Spouse name: Not on file   Number of children: Not on file   Years of education: Not on file   Highest education level: Not on file  Occupational History   Not on file  Tobacco Use   Smoking status: Never   Smokeless tobacco: Never  Vaping Use   Vaping  status: Never Used  Substance and Sexual Activity   Alcohol use: Not Currently   Drug use: Never   Sexual activity: Not Currently    Birth control/protection: None  Other Topics Concern   Not on file  Social History Narrative   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   Divorced   2 dogs (Runner, broadcasting/film/video)    Social Drivers of Corporate investment banker Strain: Not on file  Food Insecurity: Not on file  Transportation Needs: Not on file  Physical Activity: Unknown (05/20/2019)   Exercise Vital Sign    Days of Exercise per Week: 0 days    Minutes of Exercise per Session: Not on file  Stress: Not on file  Social Connections: Socially Isolated (05/20/2019)   Social Connection and Isolation Panel    Frequency of Communication with Friends and Family: More than three times a week    Frequency of Social Gatherings with Friends and Family: Once a week    Attends Religious Services: Never    Database administrator or Organizations: No    Attends Banker Meetings: Never    Marital Status: Separated  Intimate Partner Violence: Not At Risk (05/20/2019)   Humiliation, Afraid, Rape, and Kick questionnaire  Fear of Current or Ex-Partner: No    Emotionally Abused: No    Physically Abused: No    Sexually Abused: No    Outpatient Medications Prior to Visit  Medication Sig Dispense Refill   ALPRAZolam  (XANAX ) 0.5 MG tablet Take 1 tablet (0.5 mg total) by mouth at bedtime as needed. For anxiety 30 tablet 0   ibuprofen  (ADVIL ) 600 MG tablet Take 1 tablet (600 mg total) by mouth every 8 (eight) hours as needed for moderate pain (use as directed). 30 tablet 1   buPROPion  (WELLBUTRIN  XL) 300 MG 24 hr tablet Take 1 tablet (300 mg total) by mouth daily. 90 tablet 0   escitalopram  (LEXAPRO ) 20 MG tablet Take 1 tablet (20 mg total) by mouth daily. 90 tablet 1   No facility-administered medications prior to visit.    Allergies  Allergen Reactions   Sulfa  Antibiotics Shortness Of Breath   Lodine [Etodolac] Itching   Penicillins Rash    ROS See HPI    Objective:    Physical Exam Constitutional:      Appearance: Normal appearance.  Cardiovascular:     Rate and Rhythm: Normal rate.  Pulmonary:     Effort: Pulmonary effort is normal.  Neurological:     Mental Status: She is alert and oriented to person, place, and time.  Psychiatric:        Mood and Affect: Mood normal.        Behavior: Behavior normal.        Thought Content: Thought content normal.        Judgment: Judgment normal.      BP 102/70 (BP Location: Right Arm, Patient Position: Sitting, Cuff Size: Small)   Pulse 97   Temp 98.4 F (36.9 C) (Oral)   Resp 16   Ht 5' 5 (1.651 m)   Wt 150 lb (68 kg)   LMP 08/31/2018   SpO2 94%   BMI 24.96 kg/m  Wt Readings from Last 3 Encounters:  01/24/24 150 lb (68 kg)  12/13/23 151 lb (68.5 kg)  09/25/23 147 lb (66.7 kg)       Assessment & Plan:   Problem List Items Addressed This Visit       Unprioritized   Depression    Mood improved with increased dose of bupropion .  Initial insomnia resolved. Prefers family discussions over counseling. Increased activity noted. - Continue bupropion  and escitalopram  at current dosages. - Provide 90-day refills for both medications through [facility]. - Schedule follow-up in 3 months. - Advise to contact if symptoms worsen.      Relevant Medications   buPROPion  (WELLBUTRIN  XL) 300 MG 24 hr tablet   escitalopram  (LEXAPRO ) 20 MG tablet    I am having Virginia Ayala maintain her ibuprofen , ALPRAZolam , buPROPion , and escitalopram .  Meds ordered this encounter  Medications   buPROPion  (WELLBUTRIN  XL) 300 MG 24 hr tablet    Sig: Take 1 tablet (300 mg total) by mouth daily.    Dispense:  90 tablet    Refill:  0    Supervising Provider:   DOMENICA BLACKBIRD A [[id number]]   escitalopram  (LEXAPRO ) 20 MG tablet    Sig: Take 1 tablet (20 mg total) by mouth daily.    Dispense:  90 tablet     Refill:  0    Supervising Provider:   DOMENICA BLACKBIRD A [[id number]]

## 2024-01-24 NOTE — Assessment & Plan Note (Signed)
  Mood improved with increased dose of bupropion .  Initial insomnia resolved. Prefers family discussions over counseling. Increased activity noted. - Continue bupropion  and escitalopram  at current dosages. - Provide 90-day refills for both medications through [facility]. - Schedule follow-up in 3 months. - Advise to contact if symptoms worsen.

## 2024-02-27 ENCOUNTER — Other Ambulatory Visit: Payer: Self-pay | Admitting: Family

## 2024-02-27 ENCOUNTER — Other Ambulatory Visit (HOSPITAL_BASED_OUTPATIENT_CLINIC_OR_DEPARTMENT_OTHER): Payer: Self-pay

## 2024-02-27 DIAGNOSIS — F419 Anxiety disorder, unspecified: Secondary | ICD-10-CM

## 2024-02-27 MED ORDER — ALPRAZOLAM 0.5 MG PO TABS
0.5000 mg | ORAL_TABLET | Freq: Every evening | ORAL | 0 refills | Status: DC | PRN
  Filled 2024-02-27: qty 30, 30d supply, fill #0

## 2024-03-03 ENCOUNTER — Other Ambulatory Visit (HOSPITAL_COMMUNITY): Payer: Self-pay

## 2024-03-27 ENCOUNTER — Other Ambulatory Visit: Payer: Self-pay | Admitting: Family

## 2024-03-27 DIAGNOSIS — F419 Anxiety disorder, unspecified: Secondary | ICD-10-CM

## 2024-03-30 ENCOUNTER — Other Ambulatory Visit (HOSPITAL_BASED_OUTPATIENT_CLINIC_OR_DEPARTMENT_OTHER): Payer: Self-pay

## 2024-03-30 MED ORDER — ALPRAZOLAM 0.5 MG PO TABS
0.5000 mg | ORAL_TABLET | Freq: Every evening | ORAL | 0 refills | Status: DC | PRN
  Filled 2024-03-30: qty 30, 30d supply, fill #0

## 2024-04-02 LAB — HM DEXA SCAN

## 2024-04-22 ENCOUNTER — Ambulatory Visit: Admitting: Family

## 2024-04-22 VITALS — BP 105/72 | HR 87 | Temp 98.7°F | Resp 16 | Ht 65.0 in

## 2024-04-22 DIAGNOSIS — M5416 Radiculopathy, lumbar region: Secondary | ICD-10-CM

## 2024-04-22 DIAGNOSIS — F32A Depression, unspecified: Secondary | ICD-10-CM

## 2024-04-22 DIAGNOSIS — M8580 Other specified disorders of bone density and structure, unspecified site: Secondary | ICD-10-CM

## 2024-04-22 MED ORDER — CALCIUM CARB-CHOLECALCIFEROL 600-20 MG-MCG PO TABS: 1.0000 | ORAL_TABLET | Freq: Two times a day (BID) | ORAL | Status: AC

## 2024-04-22 NOTE — Progress Notes (Unsigned)
 Subjective:     Patient ID: Virginia Ayala, female    DOB: [birth year], 53 y.o.   MRN: [id number]  Chief Complaint  Patient presents with   Anxiety    Here for follow up last UDS 07/31/22, last [facility] 05/03/23   Depression    Here for follow up    HPI  Discussed the use of AI scribe software for clinical note transcription with the patient, who gave verbal consent to proceed.  History of Present Illness Virginia Ayala is a 53 year old female with depression and back pain who presents for follow-up on her mood and back pain management. Her mood has improved with Wellbutrin  300 mg and Lexapro  20 mg, though she still feels 'blah'. She attributes some mood issues to her back pain. She no longer feels 'miserable' as she did before starting these medications. She experiences significant lower back pain due to a compression fracture confirmed by MRI and x-rays. The pain affects both her upper and lower back, limiting her ability to stand for more than five minutes and perform tasks like installing a kitchen backsplash. Pain worsens by the end of the day, requiring her to lie down for relief. She previously tried gabapentin and had spinal injections, which provided only temporary relief. She is not currently on a specific pain management regimen. She takes calcium supplements and vitamin D, as advised by her mother, for bone health. She takes two vitamin D gummies daily but is unsure of the dosage and has not had a recent vitamin D level check.       Health Maintenance Due  Topic Date Due   Hepatitis B Vaccines 19-59 Average Risk (1 of 3 - 19+ 3-dose series) Never done   COVID-19 Vaccine (3 - Moderna risk series) 01/22/2020   Pneumococcal Vaccine: 50+ Years (1 of 1 - PCV) Never done    Past Medical History:  Diagnosis Date   Anxiety    Back pain    due to bulging disc lumbar spine   Depression     Past Surgical History:  Procedure Laterality Date   MOUTH SURGERY     in her 27's    ORTHOPEDIC SURGERY Left 1980    Family History  Problem Relation Age of Onset   Diabetes Father    Pulmonary embolism Father    Cancer Maternal Grandmother    Heart disease Maternal Grandmother    Heart disease Paternal Grandfather    Crohn's disease Daughter    Pulmonary embolism Paternal Aunt     Social History   Socioeconomic History   Marital status: Divorced    Spouse name: Not on file   Number of children: Not on file   Years of education: Not on file   Highest education level: Not on file  Occupational History   Not on file  Tobacco Use   Smoking status: Never   Smokeless tobacco: Never  Vaping Use   Vaping status: Never Used  Substance and Sexual Activity   Alcohol use: Not Currently   Drug use: Never   Sexual activity: Not Currently    Birth control/protection: None  Other Topics Concern   Not on file  Social History Narrative   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   Divorced   2 dogs (Runner, broadcasting/film/video)    Social Drivers of Corporate investment banker Strain: Not on file  Food Insecurity: Not on file  Transportation Needs: Not on file  Physical Activity:  Unknown (05/20/2019)   Exercise Vital Sign    Days of Exercise per Week: 0 days    Minutes of Exercise per Session: Not on file  Stress: Not on file  Social Connections: Socially Isolated (05/20/2019)   Social Connection and Isolation Panel    Frequency of Communication with Friends and Family: More than three times a week    Frequency of Social Gatherings with Friends and Family: Once a week    Attends Religious Services: Never    Database administrator or Organizations: No    Attends Banker Meetings: Never    Marital Status: Separated  Intimate Partner Violence: Not At Risk (05/20/2019)   Humiliation, Afraid, Rape, and Kick questionnaire    Fear of Current or Ex-Partner: No    Emotionally Abused: No    Physically Abused: No    Sexually Abused: No     Outpatient Medications Prior to Visit  Medication Sig Dispense Refill   ALPRAZolam  (XANAX ) 0.5 MG tablet Take 1 tablet (0.5 mg total) by mouth at bedtime as needed for anxiety. 30 tablet 0   buPROPion  (WELLBUTRIN  XL) 300 MG 24 hr tablet Take 1 tablet (300 mg total) by mouth daily. 90 tablet 0   escitalopram  (LEXAPRO ) 20 MG tablet Take 1 tablet (20 mg total) by mouth daily. 90 tablet 0   ibuprofen  (ADVIL ) 600 MG tablet Take 1 tablet (600 mg total) by mouth every 8 (eight) hours as needed for moderate pain (use as directed). 30 tablet 1   No facility-administered medications prior to visit.    Allergies  Allergen Reactions   Sulfa Antibiotics Shortness Of Breath   Lodine [Etodolac] Itching   Penicillins Rash    ROS See HPI    Objective:    Physical Exam Constitutional:      General: She is not in acute distress.    Appearance: Normal appearance. She is well-developed.  HENT:     Head: Normocephalic and atraumatic.     Right Ear: External ear normal.     Left Ear: External ear normal.  Eyes:     General: No scleral icterus. Neck:     Thyroid : No thyromegaly.  Cardiovascular:     Rate and Rhythm: Normal rate and regular rhythm.     Heart sounds: Normal heart sounds. No murmur heard. Pulmonary:     Effort: Pulmonary effort is normal. No respiratory distress.     Breath sounds: Normal breath sounds. No wheezing.  Musculoskeletal:     Cervical back: Neck supple.  Skin:    General: Skin is warm and dry.  Neurological:     Mental Status: She is alert and oriented to person, place, and time.  Psychiatric:        Mood and Affect: Mood normal.        Behavior: Behavior normal.        Thought Content: Thought content normal.        Judgment: Judgment normal.      BP 105/72 (BP Location: Right Arm, Patient Position: Sitting, Cuff Size: Normal)   Pulse 87   Temp 98.7 F (37.1 C) (Oral)   Resp 16   Ht 5' 5 (1.651 m)   LMP 08/31/2018   SpO2 97%   BMI 24.96 kg/m   Wt Readings from Last 3 Encounters:  01/24/24 150 lb (68 kg)  12/13/23 151 lb (68.5 kg)  09/25/23 147 lb (66.7 kg)       Assessment & Plan:   Problem  List Items Addressed This Visit       Unprioritized   Osteopenia - Primary   New diagnosis. Does not meet frax threshold for bisphosphonate. Recommend caltrate 600 plus D bid, regular weight bearing exercise, repeat dexa in 2 years.  Check vit D as well.       Relevant Medications   Calcium Carb-Cholecalciferol (CALTRATE 600+D3) 600-20 MG-MCG TABS   Other Relevant Orders   VITAMIN D 25 Hydroxy (Vit-D Deficiency, Fractures)   Lumbar radiculopathy   Chronic back pain is her primary complaint today.  She was noted to have a subacute compression fracture at T12.  This is causing her a great deal of pain.  I have advised her to follow back up with her initial neurosurgeon.       Depression    Mood issues attributed in part to back pain. Wellbutrin  300 mg and Lexapro  20 mg have improved mood symptoms.  - Continue Wellbutrin  300 mg and Lexapro  20 mg.        I am having Lener Ebey start on Calcium Carb-Cholecalciferol. I am also having her maintain her ibuprofen , buPROPion , escitalopram , and ALPRAZolam .  Meds ordered this encounter  Medications   Calcium Carb-Cholecalciferol (CALTRATE 600+D3) 600-20 MG-MCG TABS    Sig: Take 1 tablet by mouth in the morning and at bedtime.    Supervising Provider:   DOMENICA BLACKBIRD A [phone]

## 2024-04-23 DIAGNOSIS — M8580 Other specified disorders of bone density and structure, unspecified site: Secondary | ICD-10-CM | POA: Insufficient documentation

## 2024-04-23 NOTE — Assessment & Plan Note (Addendum)
 New diagnosis. Does not meet frax threshold for bisphosphonate. Recommend caltrate 600 plus D bid, regular weight bearing exercise, repeat dexa in 2 years.  Check vit D as well.

## 2024-04-23 NOTE — Assessment & Plan Note (Signed)
  Mood issues attributed in part to back pain. Wellbutrin  300 mg and Lexapro  20 mg have improved mood symptoms.  - Continue Wellbutrin  300 mg and Lexapro  20 mg.

## 2024-04-23 NOTE — Patient Instructions (Addendum)
 VISIT SUMMARY:  Today, we discussed your mood and back pain management. Your mood has improved with your current medications, but you still feel somewhat down. Your back pain, due to a compression fracture, continues to limit your daily activities.  YOUR PLAN:  LOW BACK PAIN WITH T12 COMPRESSION FRACTURE AND OSTEOPENIA OF SPINE: You have a mild T12 compression fracture and lower bone density in your spine, which is causing significant back pain. -Start taking Caltrate 600 mg twice daily to reach a total of 1200 mg of calcium daily. -We will order a vitamin D level test to check your current levels. -Engage in weight-bearing exercises such as walking and using a recumbent bike. - Please schedule a follow up visit with your original neurosurgeon.  DEPRESSION: Your mood has improved with Wellbutrin  300 mg and Lexapro  20 mg, but you still feel somewhat down, likely due to your back pain. -Continue taking Wellbutrin  300 mg and Lexapro  20 mg as prescribed.

## 2024-04-23 NOTE — Assessment & Plan Note (Signed)
 Chronic back pain is her primary complaint today.  She was noted to have a subacute compression fracture at T12.  This is causing her a great deal of pain.  I have advised her to follow back up with her initial neurosurgeon.

## 2024-05-05 ENCOUNTER — Other Ambulatory Visit: Payer: Self-pay | Admitting: Family

## 2024-05-05 DIAGNOSIS — F32A Depression, unspecified: Secondary | ICD-10-CM

## 2024-05-06 ENCOUNTER — Other Ambulatory Visit: Payer: Self-pay | Admitting: Family

## 2024-05-06 DIAGNOSIS — F419 Anxiety disorder, unspecified: Secondary | ICD-10-CM

## 2024-05-07 ENCOUNTER — Other Ambulatory Visit: Payer: Self-pay

## 2024-05-07 ENCOUNTER — Other Ambulatory Visit (HOSPITAL_BASED_OUTPATIENT_CLINIC_OR_DEPARTMENT_OTHER): Payer: Self-pay

## 2024-05-07 MED ORDER — ALPRAZOLAM 0.5 MG PO TABS
0.5000 mg | ORAL_TABLET | Freq: Every evening | ORAL | 0 refills | Status: DC | PRN
  Filled 2024-05-07: qty 30, 30d supply, fill #0

## 2024-05-07 NOTE — Telephone Encounter (Signed)
 Requesting: alprazolam  0.5mg  Contract:04/22/24 UDS:07/31/22 Last Visit: 04/22/24 Next Visit: None Last Refill: 03/30/24 #30 and 0RF   Please Advise

## 2024-05-29 ENCOUNTER — Other Ambulatory Visit: Payer: Self-pay | Admitting: Family

## 2024-05-29 DIAGNOSIS — F419 Anxiety disorder, unspecified: Secondary | ICD-10-CM

## 2024-05-30 ENCOUNTER — Other Ambulatory Visit: Payer: Self-pay | Admitting: Family

## 2024-05-30 DIAGNOSIS — F32A Depression, unspecified: Secondary | ICD-10-CM

## 2024-05-30 MED ORDER — ALPRAZOLAM 0.5 MG PO TABS
0.5000 mg | ORAL_TABLET | Freq: Every evening | ORAL | 0 refills | Status: DC | PRN
  Filled 2024-05-30 – 2024-06-05 (×2): qty 30, 30d supply, fill #0

## 2024-05-30 NOTE — Telephone Encounter (Signed)
 Requesting: alprazolam  0.5mg  Contract: Yes UDS: 07/31/22 Last Visit: 04/22/2024 Next Visit: 05/30/2024 Last Refill: 05/07/24  Please Advise

## 2024-05-31 ENCOUNTER — Other Ambulatory Visit (HOSPITAL_BASED_OUTPATIENT_CLINIC_OR_DEPARTMENT_OTHER): Payer: Self-pay

## 2024-06-01 ENCOUNTER — Other Ambulatory Visit (HOSPITAL_BASED_OUTPATIENT_CLINIC_OR_DEPARTMENT_OTHER): Payer: Self-pay

## 2024-06-05 ENCOUNTER — Other Ambulatory Visit: Payer: Self-pay

## 2024-06-05 ENCOUNTER — Other Ambulatory Visit (HOSPITAL_BASED_OUTPATIENT_CLINIC_OR_DEPARTMENT_OTHER): Payer: Self-pay

## 2024-07-10 ENCOUNTER — Other Ambulatory Visit: Payer: Self-pay

## 2024-07-10 ENCOUNTER — Other Ambulatory Visit (HOSPITAL_BASED_OUTPATIENT_CLINIC_OR_DEPARTMENT_OTHER): Payer: Self-pay

## 2024-07-10 ENCOUNTER — Other Ambulatory Visit: Payer: Self-pay | Admitting: Family

## 2024-07-10 DIAGNOSIS — F419 Anxiety disorder, unspecified: Secondary | ICD-10-CM

## 2024-07-10 MED ORDER — ALPRAZOLAM 0.5 MG PO TABS
0.5000 mg | ORAL_TABLET | Freq: Every evening | ORAL | 0 refills | Status: AC | PRN
  Filled 2024-07-10: qty 30, 30d supply, fill #0

## 2024-07-17 ENCOUNTER — Other Ambulatory Visit: Payer: Self-pay | Admitting: Family

## 2024-07-17 DIAGNOSIS — F32A Depression, unspecified: Secondary | ICD-10-CM

## 2024-08-05 ENCOUNTER — Encounter: Payer: Self-pay | Admitting: Family
